# Patient Record
Sex: Male | Born: 1950 | Race: White | Hispanic: No | Marital: Married | Smoking: Former smoker
Health system: Southern US, Community
[De-identification: ages and names within clinical notes are randomized; demographics above are authoritative.]

## PROBLEM LIST (undated history)

## (undated) DIAGNOSIS — R112 Nausea with vomiting, unspecified: Secondary | ICD-10-CM

## (undated) DIAGNOSIS — K449 Diaphragmatic hernia without obstruction or gangrene: Secondary | ICD-10-CM

## (undated) DIAGNOSIS — D369 Benign neoplasm, unspecified site: Secondary | ICD-10-CM

## (undated) DIAGNOSIS — K635 Polyp of colon: Secondary | ICD-10-CM

## (undated) DIAGNOSIS — G4733 Obstructive sleep apnea (adult) (pediatric): Secondary | ICD-10-CM

## (undated) DIAGNOSIS — Z9889 Other specified postprocedural states: Secondary | ICD-10-CM

## (undated) DIAGNOSIS — K589 Irritable bowel syndrome without diarrhea: Secondary | ICD-10-CM

## (undated) DIAGNOSIS — E785 Hyperlipidemia, unspecified: Secondary | ICD-10-CM

## (undated) DIAGNOSIS — E669 Obesity, unspecified: Secondary | ICD-10-CM

## (undated) DIAGNOSIS — I493 Ventricular premature depolarization: Secondary | ICD-10-CM

## (undated) DIAGNOSIS — K649 Unspecified hemorrhoids: Secondary | ICD-10-CM

## (undated) DIAGNOSIS — J449 Chronic obstructive pulmonary disease, unspecified: Secondary | ICD-10-CM

## (undated) DIAGNOSIS — N189 Chronic kidney disease, unspecified: Secondary | ICD-10-CM

## (undated) DIAGNOSIS — I2510 Atherosclerotic heart disease of native coronary artery without angina pectoris: Secondary | ICD-10-CM

## (undated) DIAGNOSIS — I10 Essential (primary) hypertension: Secondary | ICD-10-CM

## (undated) HISTORY — DX: Diaphragmatic hernia without obstruction or gangrene: K44.9

## (undated) HISTORY — DX: Irritable bowel syndrome, unspecified: K58.9

## (undated) HISTORY — PX: CYSTECTOMY: SUR359

## (undated) HISTORY — DX: Benign neoplasm, unspecified site: D36.9

## (undated) HISTORY — DX: Unspecified hemorrhoids: K64.9

## (undated) HISTORY — DX: Essential (primary) hypertension: I10

## (undated) HISTORY — DX: Polyp of colon: K63.5

## (undated) HISTORY — DX: Obesity, unspecified: E66.9

## (undated) HISTORY — DX: Ventricular premature depolarization: I49.3

## (undated) HISTORY — DX: Hyperlipidemia, unspecified: E78.5

---

## 2007-12-27 ENCOUNTER — Ambulatory Visit: Payer: Self-pay | Admitting: Cardiology

## 2007-12-28 ENCOUNTER — Inpatient Hospital Stay (HOSPITAL_COMMUNITY): Admission: EM | Admit: 2007-12-28 | Discharge: 2007-12-29 | Payer: Self-pay | Admitting: Emergency Medicine

## 2007-12-28 ENCOUNTER — Encounter (INDEPENDENT_AMBULATORY_CARE_PROVIDER_SITE_OTHER): Payer: Self-pay

## 2007-12-29 ENCOUNTER — Encounter (INDEPENDENT_AMBULATORY_CARE_PROVIDER_SITE_OTHER): Payer: Self-pay

## 2007-12-29 LAB — CONVERTED CEMR LAB: TSH: 0.967 microintl units/mL

## 2008-01-10 ENCOUNTER — Ambulatory Visit: Payer: Self-pay | Admitting: Cardiology

## 2008-01-10 ENCOUNTER — Encounter: Payer: Self-pay | Admitting: Cardiology

## 2008-01-10 ENCOUNTER — Ambulatory Visit (HOSPITAL_COMMUNITY): Admission: RE | Admit: 2008-01-10 | Discharge: 2008-01-10 | Payer: Self-pay | Admitting: Cardiology

## 2008-04-10 ENCOUNTER — Ambulatory Visit: Payer: Self-pay | Admitting: Cardiology

## 2008-09-04 DIAGNOSIS — I10 Essential (primary) hypertension: Secondary | ICD-10-CM

## 2008-09-04 DIAGNOSIS — E119 Type 2 diabetes mellitus without complications: Secondary | ICD-10-CM | POA: Insufficient documentation

## 2008-10-09 ENCOUNTER — Ambulatory Visit: Payer: Self-pay | Admitting: Internal Medicine

## 2008-10-09 DIAGNOSIS — D126 Benign neoplasm of colon, unspecified: Secondary | ICD-10-CM

## 2008-10-09 DIAGNOSIS — R143 Flatulence: Secondary | ICD-10-CM

## 2008-10-09 DIAGNOSIS — Z860100 Personal history of colon polyps, unspecified: Secondary | ICD-10-CM | POA: Insufficient documentation

## 2008-10-09 DIAGNOSIS — R109 Unspecified abdominal pain: Secondary | ICD-10-CM | POA: Insufficient documentation

## 2008-10-09 DIAGNOSIS — R141 Gas pain: Secondary | ICD-10-CM

## 2008-10-09 DIAGNOSIS — R142 Eructation: Secondary | ICD-10-CM

## 2008-10-10 ENCOUNTER — Encounter: Payer: Self-pay | Admitting: Internal Medicine

## 2008-10-16 ENCOUNTER — Encounter: Payer: Self-pay | Admitting: Internal Medicine

## 2008-11-04 ENCOUNTER — Encounter: Payer: Self-pay | Admitting: Internal Medicine

## 2008-11-04 ENCOUNTER — Ambulatory Visit: Payer: Self-pay | Admitting: Internal Medicine

## 2008-11-04 ENCOUNTER — Ambulatory Visit (HOSPITAL_COMMUNITY): Admission: RE | Admit: 2008-11-04 | Discharge: 2008-11-04 | Payer: Self-pay | Admitting: Internal Medicine

## 2008-11-07 ENCOUNTER — Encounter: Payer: Self-pay | Admitting: Internal Medicine

## 2008-11-07 ENCOUNTER — Encounter (INDEPENDENT_AMBULATORY_CARE_PROVIDER_SITE_OTHER): Payer: Self-pay

## 2008-11-10 ENCOUNTER — Ambulatory Visit: Payer: Self-pay | Admitting: Cardiology

## 2008-11-10 ENCOUNTER — Telehealth (INDEPENDENT_AMBULATORY_CARE_PROVIDER_SITE_OTHER): Payer: Self-pay

## 2008-11-10 DIAGNOSIS — R002 Palpitations: Secondary | ICD-10-CM

## 2008-12-30 ENCOUNTER — Encounter: Payer: Self-pay | Admitting: Gastroenterology

## 2009-04-23 ENCOUNTER — Encounter: Payer: Self-pay | Admitting: Gastroenterology

## 2009-06-03 ENCOUNTER — Ambulatory Visit: Payer: Self-pay | Admitting: Internal Medicine

## 2009-06-05 ENCOUNTER — Ambulatory Visit (HOSPITAL_COMMUNITY): Admission: RE | Admit: 2009-06-05 | Discharge: 2009-06-05 | Payer: Self-pay | Admitting: Internal Medicine

## 2009-06-08 ENCOUNTER — Encounter: Payer: Self-pay | Admitting: Internal Medicine

## 2009-06-22 ENCOUNTER — Encounter: Payer: Self-pay | Admitting: Internal Medicine

## 2009-06-22 ENCOUNTER — Observation Stay (HOSPITAL_COMMUNITY): Admission: RE | Admit: 2009-06-22 | Discharge: 2009-06-24 | Payer: Self-pay | Admitting: Urology

## 2009-06-29 ENCOUNTER — Encounter: Payer: Self-pay | Admitting: Internal Medicine

## 2009-07-02 ENCOUNTER — Ambulatory Visit (HOSPITAL_COMMUNITY): Admission: RE | Admit: 2009-07-02 | Discharge: 2009-07-02 | Payer: Self-pay | Admitting: Urology

## 2009-07-16 ENCOUNTER — Telehealth: Payer: Self-pay | Admitting: Urgent Care

## 2009-07-23 ENCOUNTER — Ambulatory Visit (HOSPITAL_COMMUNITY): Admission: RE | Admit: 2009-07-23 | Discharge: 2009-07-23 | Payer: Self-pay | Admitting: Urology

## 2009-08-31 ENCOUNTER — Encounter (INDEPENDENT_AMBULATORY_CARE_PROVIDER_SITE_OTHER): Payer: Self-pay | Admitting: *Deleted

## 2009-09-08 ENCOUNTER — Ambulatory Visit (HOSPITAL_COMMUNITY): Admission: RE | Admit: 2009-09-08 | Discharge: 2009-09-08 | Payer: Self-pay | Admitting: Urology

## 2009-11-30 ENCOUNTER — Encounter (INDEPENDENT_AMBULATORY_CARE_PROVIDER_SITE_OTHER): Payer: Self-pay | Admitting: *Deleted

## 2009-12-10 ENCOUNTER — Emergency Department (HOSPITAL_COMMUNITY): Admission: EM | Admit: 2009-12-10 | Discharge: 2009-05-06 | Payer: Self-pay | Admitting: Emergency Medicine

## 2009-12-21 ENCOUNTER — Ambulatory Visit (HOSPITAL_COMMUNITY)
Admission: RE | Admit: 2009-12-21 | Discharge: 2009-12-21 | Payer: Self-pay | Source: Home / Self Care | Attending: Urology | Admitting: Urology

## 2010-02-03 DIAGNOSIS — I2510 Atherosclerotic heart disease of native coronary artery without angina pectoris: Secondary | ICD-10-CM

## 2010-02-03 HISTORY — DX: Atherosclerotic heart disease of native coronary artery without angina pectoris: I25.10

## 2010-02-04 NOTE — Letter (Signed)
Summary: referral-dr gary carl  referral-dr gary carl   Imported By: Rosine Beat 06/22/2009 13:14:54  _____________________________________________________________________  External Attachment:    Type:   Image     Comment:   External Document

## 2010-02-04 NOTE — Medication Information (Signed)
Summary: Tax adviser   Imported By: Rosine Beat 08/31/2009 09:16:53  _____________________________________________________________________  External Attachment:    Type:   Image     Comment:   External Document

## 2010-02-04 NOTE — Letter (Signed)
Summary: CT SCAN ORDER  CT SCAN ORDER   Imported By: Ave Filter 06/03/2009 14:25:15  _____________________________________________________________________  External Attachment:    Type:   Image     Comment:   External Document

## 2010-02-04 NOTE — Assessment & Plan Note (Signed)
Summary: bloating and abdominal pain- cdg   Visit Type:  Follow-up Visit Primary Care Provider:  Melynda Keller  Chief Complaint:  bloating and abdominal pain.  History of Present Illness: Jerry Williams is here for f/u. He was last seen at time of TCS 11/10. Prep was poor. He had polyps as follows; one mid descending status post hot snare removal, one ascending colon polyp status post cold snare removed, multiple diminutive ascending colon polyp status post hot snare ablation. His f/u TCS will be 11/2011. He c/o ongoing abdominal bloating and excessive gas. In the last three weeks, he has had 4-5 severe episodes of upper abd pain radiating into flanks, mid-abd cramping, which wakes him up at night. He went to ED in 5/11 and w/u for MI which was negative. He states most symptoms are after he goes to bed. If he has really bad night, symptoms may persist throughout following day. He also notes pp excessive belching. He wakes up at times with bitter taste in mouth and heartburn. Takes Nexium at lunchtime. Often has BM with abd cramping that wakes him up at night. Takes Bentyl four times per day and occasionally additional one in middle of night. No melena, brbpr. Has BM two times a day. Never eats past 6pm. Goes to bed at 11pm. Weight up ten pounds.        Current Medications (verified): 1)  Metoprolol Tartrate 50 Mg Tabs (Metoprolol Tartrate) .... Two Times A Day 2)  Lopid 600 Mg Tabs (Gemfibrozil) .... Two Times A Day 3)  Bentyl 20 Mg Tabs (Dicyclomine Hcl) .... Qid As Needed 4)  Janumet 50-1000 Mg Tabs (Sitagliptin-Metformin Hcl) .... Two Times A Day 5)  Benazepril Hcl 20 Mg Tabs (Benazepril Hcl) .... Once Daily 6)  Fibercon 625 Mg Tabs (Calcium Polycarbophil) .... 2 By Mouth Daily 7)  Nexium 40 Mg Cpdr (Esomeprazole Magnesium) .... One By Mouth Daily 8)  Benazepril Hcl 10 Mg Tabs (Benazepril Hcl) .... Take 1 Tablet By Mouth Once A Day 9)  Actos 30 Mg Tabs (Pioglitazone Hcl) .... Take 1 Tablet  By Mouth Once A Day 10)  Magnesium 250 Mg .... One Tablet Daily 11)  Aspir-Trin 325 Mg Tbec (Aspirin) .... Take 1 Tablet By Mouth Once A Day  Allergies (verified): 1)  ! 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 Elpidio Anis, 2 polyps were not ablated due to awkward positioning Hyperlipidemia Obesity PVCs IBS TCS 11/10 Poor prep compromised exam, grossly normal rectum, left-sided diverticula, polyps as follows; one mid descending status post hot snare removal, one ascending colon polyp status post cold snare removed, multiple diminutive ascending colon polyp status post hot snare ablation. Follow up TCS in 3 years.  Review of Systems      See HPI  Vital Signs:  Patient profile:   60 year old male Height:      72 inches Weight:      246 pounds BMI:     33.48 Temp:     98.6 degrees F oral Pulse rate:   80 / minute BP sitting:   130 / 80  (left arm) Cuff size:   regular  Vitals Entered By: Jerry Spring LPN (June 03, 452 1:33 PM)  Physical Exam  General:  Well developed, well nourished, no acute distress.obese.   Head:  Normocephalic and atraumatic. Eyes:  sclera nonicteric  Mouth:  op moist Lungs:  Clear throughout to auscultation. Heart:  Regular rate and rhythm; no murmurs, rubs,  or bruits. Abdomen:  Obese. Soft. Positive bowel sounds. Tenderness with subjective guarding noted in rlq. Also diffuse upper abd tenderness. Due to patient discomfort, could not evaluate for HSM or masses. No abd bruit. Extremities:  No clubbing, cyanosis, edema or deformities noted. Neurologic:  Alert and  oriented x4;  grossly normal neurologically. Skin:  Intact without significant lesions or rashes. Psych:  Alert and cooperative. Normal mood and affect.  Impression & Recommendations:  Problem #  1:  ABDOMINAL PAIN, UNSPECIFIED SITE (ICD-789.00)  Persistent abd pain wakes him up at night. Vague pp belching and bloating. BM regular. Significant tenderness on exam today. Recommend CT A/P with IV/oral contrast. Will add probiotic one daily for four weeks. He will take Nexium before evening meal. Further recommendations to follow.  Orders: Est. Patient Level II (09811)

## 2010-02-04 NOTE — Letter (Signed)
Summary: Appointment - Reminder 2  [facility] [place]. [address] [place] [address]   Phone: [phone]  Fax: [phone]     November 30, 2009 MRN: [id number]   [facility] [address] [place], [place]  [address]   Dear Jerry Williams,  Our records indicate that it is time to schedule a follow-up appointment.  Dr.  Daleen Squibb        recommended that you follow up with [place] in     11/2009 PAST DUE       . It is very important that we reach you to schedule this appointment. We look forward to participating in your health care needs. Please contact us at the number listed above at your earliest convenience to schedule your appointment.  If you are unable to make an appointment at this time, give [place] a call so we can update our records.     Sincerely,   Glass blower/designer

## 2010-02-04 NOTE — Letter (Signed)
Summary: DR Baldo Ash REFERRAL-UROLOGY  DR CARL REFERRAL-UROLOGY   Imported By: Ave Filter 06/08/2009 11:24:19  _____________________________________________________________________  External Attachment:    Type:   Image     Comment:   External Document

## 2010-02-04 NOTE — Letter (Signed)
Summary: UROLOGY REFERRAL  UROLOGY REFERRAL   Imported By: Ave Filter 06/08/2009 09:03:15  _____________________________________________________________________  External Attachment:    Type:   Image     Comment:   External Document  Appended Document: UROLOGY REFERRAL I cx office visit per pt's wife.[facility] require a 150 dollar downpayment for all uninsured pts.

## 2010-02-04 NOTE — Progress Notes (Signed)
Summary: nexium  ---- Converted from flag ---- ---- 07/16/2009 10:42 AM, Peggyann Shoals wrote: Pt's wife called and said he needs a new Rx for Nexium sent to [facility]- pts # is [phone] ------------------------------  Appended Document: nexium    Prescriptions: NEXIUM 40 MG CPDR (ESOMEPRAZOLE MAGNESIUM) one by mouth daily  #90 x 3   Entered and Authorized by:   Joselyn Arrow FNP-BC   Signed by:   Joselyn Arrow FNP-BC on 07/20/2009   Method used:   Print then Give to Patient   RxID:   [id number]

## 2010-02-04 NOTE — Letter (Signed)
Summary: office consult note-aus-dr peterson, l  office consult note-aus-dr peterson, l   Imported By: Rosine Beat 06/29/2009 11:37:03  _____________________________________________________________________  External Attachment:    Type:   Image     Comment:   External Document

## 2010-02-04 NOTE — Medication Information (Signed)
Summary: pt assistance form-nexium  pt assistance form-nexium   Imported By: Hendricks Limes LPN 91/47/8295 62:13:08  _____________________________________________________________________  External Attachment:    Type:   Image     Comment:   External Document

## 2010-03-15 LAB — URINE CULTURE
Colony Count: >=100000
Culture  Setup Time: 201112200140
Special Requests: NEGATIVE

## 2010-03-15 LAB — GLUCOSE, CAPILLARY
Glucose-Capillary: 142 mg/dL — ABNORMAL HIGH (ref 70–99)
Glucose-Capillary: 155 mg/dL — ABNORMAL HIGH (ref 70–99)

## 2010-03-16 LAB — CBC
HCT: 41.6 % (ref 39.0–52.0)
Hemoglobin: 13.9 g/dL (ref 13.0–17.0)
MCH: 30.1 pg (ref 26.0–34.0)
MCHC: 33.4 g/dL (ref 30.0–36.0)
MCV: 90 fL (ref 78.0–100.0)
Platelets: 202 10*3/uL (ref 150–400)
RBC: 4.62 MIL/uL (ref 4.22–5.81)
RDW: 13.7 % (ref 11.5–15.5)
WBC: 8.7 10*3/uL (ref 4.0–10.5)

## 2010-03-16 LAB — COMPREHENSIVE METABOLIC PANEL
ALT: 23 U/L (ref 0–53)
AST: 34 U/L (ref 0–37)
Albumin: 3.4 g/dL — ABNORMAL LOW (ref 3.5–5.2)
Alkaline Phosphatase: 74 U/L (ref 39–117)
BUN: 16 mg/dL (ref 6–23)
CO2: 22 mEq/L (ref 19–32)
Calcium: 9.6 mg/dL (ref 8.4–10.5)
Chloride: 99 mEq/L (ref 96–112)
Creatinine, Ser: 1.01 mg/dL (ref 0.4–1.5)
GFR calc Af Amer: >60 mL/min (ref >60)
GFR calc non Af Amer: >60 mL/min (ref >60)
Glucose, Bld: 133 mg/dL — ABNORMAL HIGH (ref 70–99)
Potassium: 3.8 mEq/L (ref 3.5–5.1)
Sodium: 136 mEq/L (ref 135–145)
Total Bilirubin: 0.5 mg/dL (ref 0.3–1.2)
Total Protein: 7.4 g/dL (ref 6.0–8.3)

## 2010-03-16 LAB — SURGICAL PCR SCREEN
MRSA, PCR: NEGATIVE
Staphylococcus aureus: NEGATIVE

## 2010-03-18 LAB — COMPREHENSIVE METABOLIC PANEL
ALT: 17 U/L (ref 0–53)
AST: 23 U/L (ref 0–37)
Albumin: 4 g/dL (ref 3.5–5.2)
Alkaline Phosphatase: 59 U/L (ref 39–117)
BUN: 16 mg/dL (ref 6–23)
Calcium: 10 mg/dL (ref 8.4–10.5)
Chloride: 106 mEq/L (ref 96–112)
Creatinine, Ser: 0.69 mg/dL (ref 0.4–1.5)
GFR calc Af Amer: >60 mL/min (ref >60)
GFR calc non Af Amer: >60 mL/min (ref >60)
Glucose, Bld: 263 mg/dL — ABNORMAL HIGH (ref 70–99)
Potassium: 3.9 mEq/L (ref 3.5–5.1)

## 2010-03-18 LAB — SURGICAL PCR SCREEN: Staphylococcus aureus: NEGATIVE

## 2010-03-18 LAB — CBC: MCHC: 34.7 g/dL (ref 30.0–36.0)

## 2010-03-20 LAB — GLUCOSE, CAPILLARY: Glucose-Capillary: 104 mg/dL — ABNORMAL HIGH (ref 70–99)

## 2010-03-21 LAB — GLUCOSE, CAPILLARY
Glucose-Capillary: 173 mg/dL — ABNORMAL HIGH (ref 70–99)
Glucose-Capillary: 218 mg/dL — ABNORMAL HIGH (ref 70–99)
Glucose-Capillary: 233 mg/dL — ABNORMAL HIGH (ref 70–99)
Glucose-Capillary: 249 mg/dL — ABNORMAL HIGH (ref 70–99)
Glucose-Capillary: 257 mg/dL — ABNORMAL HIGH (ref 70–99)
Glucose-Capillary: 257 mg/dL — ABNORMAL HIGH (ref 70–99)
Glucose-Capillary: 258 mg/dL — ABNORMAL HIGH (ref 70–99)
Glucose-Capillary: 274 mg/dL — ABNORMAL HIGH (ref 70–99)
Glucose-Capillary: 282 mg/dL — ABNORMAL HIGH (ref 70–99)
Glucose-Capillary: 293 mg/dL — ABNORMAL HIGH (ref 70–99)
Glucose-Capillary: 387 mg/dL — ABNORMAL HIGH (ref 70–99)

## 2010-03-21 LAB — COMPREHENSIVE METABOLIC PANEL
ALT: 37 U/L (ref 0–53)
AST: 25 U/L (ref 0–37)
Albumin: 3.1 g/dL — ABNORMAL LOW (ref 3.5–5.2)
Alkaline Phosphatase: 59 U/L (ref 39–117)
BUN: 30 mg/dL — ABNORMAL HIGH (ref 6–23)
CO2: 22 mEq/L (ref 19–32)
Calcium: 9.3 mg/dL (ref 8.4–10.5)
Chloride: 102 mEq/L (ref 96–112)
Creatinine, Ser: 1.22 mg/dL (ref 0.4–1.5)
GFR calc Af Amer: >60 mL/min (ref >60)
GFR calc non Af Amer: >60 mL/min (ref >60)
Glucose, Bld: 237 mg/dL — ABNORMAL HIGH (ref 70–99)
Potassium: 3.9 mEq/L (ref 3.5–5.1)
Sodium: 133 mEq/L — ABNORMAL LOW (ref 135–145)
Total Bilirubin: 1.2 mg/dL (ref 0.3–1.2)
Total Protein: 7.5 g/dL (ref 6.0–8.3)

## 2010-03-21 LAB — CBC
HCT: 42 % (ref 39.0–52.0)
Hemoglobin: 14.1 g/dL (ref 13.0–17.0)
MCHC: 33.6 g/dL (ref 30.0–36.0)
MCV: 90 fL (ref 78.0–100.0)
Platelets: 165 10*3/uL (ref 150–400)
RBC: 4.67 MIL/uL (ref 4.22–5.81)
RDW: 14.2 % (ref 11.5–15.5)
WBC: 9.3 10*3/uL (ref 4.0–10.5)

## 2010-03-21 LAB — URINE MICROSCOPIC-ADD ON

## 2010-03-21 LAB — URINE CULTURE
Colony Count: >=100000
Special Requests: POSITIVE

## 2010-03-21 LAB — DIFFERENTIAL
Basophils Absolute: 0 10*3/uL (ref 0.0–0.1)
Basophils Relative: 0 % (ref 0–1)
Eosinophils Absolute: 0 10*3/uL (ref 0.0–0.7)
Eosinophils Relative: 0 % (ref 0–5)
Lymphocytes Relative: 7 % — ABNORMAL LOW (ref 12–46)
Lymphs Abs: 0.7 10*3/uL (ref 0.7–4.0)
Monocytes Absolute: 0.6 10*3/uL (ref 0.1–1.0)
Monocytes Relative: 7 % (ref 3–12)
Neutro Abs: 7.9 10*3/uL — ABNORMAL HIGH (ref 1.7–7.7)
Neutrophils Relative %: 86 % — ABNORMAL HIGH (ref 43–77)

## 2010-03-21 LAB — URINALYSIS, ROUTINE W REFLEX MICROSCOPIC
Glucose, UA: 250 mg/dL — AB
Nitrite: POSITIVE — AB
Protein, ur: 100 mg/dL — AB
Specific Gravity, Urine: 1.021 (ref 1.005–1.030)
Urobilinogen, UA: 1 mg/dL (ref 0.0–1.0)
pH: 5.5 (ref 5.0–8.0)

## 2010-03-21 LAB — SURGICAL PCR SCREEN: Staphylococcus aureus: POSITIVE — AB

## 2010-03-23 LAB — CBC
Hemoglobin: 14.9 g/dL (ref 13.0–17.0)
MCHC: 35.8 g/dL (ref 30.0–36.0)
Platelets: 189 10*3/uL (ref 150–400)
RDW: 13.9 % (ref 11.5–15.5)

## 2010-03-23 LAB — DIFFERENTIAL
Basophils Absolute: 0 10*3/uL (ref 0.0–0.1)
Basophils Relative: 1 % (ref 0–1)
Monocytes Absolute: 0.5 10*3/uL (ref 0.1–1.0)
Neutro Abs: 2.4 10*3/uL (ref 1.7–7.7)
Neutrophils Relative %: 47 % (ref 43–77)

## 2010-03-23 LAB — BASIC METABOLIC PANEL
BUN: 15 mg/dL (ref 6–23)
CO2: 22 mEq/L (ref 19–32)
Calcium: 9.7 mg/dL (ref 8.4–10.5)
Creatinine, Ser: 0.93 mg/dL (ref 0.4–1.5)
Glucose, Bld: 177 mg/dL — ABNORMAL HIGH (ref 70–99)

## 2010-03-23 LAB — POCT CARDIAC MARKERS
CKMB, poc: 1.1 ng/mL (ref 1.0–8.0)
CKMB, poc: <1 ng/mL — ABNORMAL LOW (ref 1.0–8.0)

## 2010-05-18 NOTE — Discharge Summary (Signed)
NAME:  Jerry Williams, Jerry Williams             ACCOUNT NO.:  [id number]   MEDICAL RECORD NO.:  [id number]          PATIENT TYPE:  INP   LOCATION:  A331                          FACILITY:  [facility]   PHYSICIAN:  Skeet Latch, DO    DATE OF BIRTH:  1950-07-07   DATE OF ADMISSION:  12/27/2007  DATE OF DISCHARGE:  12/26/2009LH                               DISCHARGE SUMMARY   DISCHARGE DIAGNOSES:  1. Tachy-palpitations, resolved.  2. History of hypertension.  3. History of diabetes.  4. History of diverticulosis.  5. History of hyperlipidemia.   BRIEF HOSPITAL COURSE:  This is a 60 year old male who presented to  emergency room with complaints of rapid heartbeat and fluttering of his  heart.  The patient reported that his heart rate was going fast and  skipping beats intermittently.  The patient reports also having some  lightheadedness, but no syncopal episode, and also stating he is  diaphoretic.  The patient states symptoms started on and off for last  approximately 6 months.  The patient has no history of any cardiac  evaluation of any kind.  The patient denied any chest pain or shortness  of breath.  EKG showed some multiple PVCs with bigeminy.  In the ER, the  patient's heart rate was in 130s.  His EKG showed sinus rhythm.  The  patient was admitted with consultation with [facility].  As  stated, initial EKG showed normal sinus rhythm with multiple PVCs.  Initial blood work showed a white count of 5.9, hemoglobin 15.9,  hematocrit 46, platelet count 217,000.  Sodium was 139, potassium 3.9,  chloride 109, CO2 of 22, glucose 176, BUN 11, and creatinine 0.83.  Initial cardiac enzymes were unremarkable.  Chest x-ray showed  cardiomegaly with COPD.  The patient was admitted and placed on his home  medications as well as placed on DVT prophylaxis and GI prophylaxis.  Consultation with Cardiology, the patient was found to have tachy-  palpitations.  They will rule out paroxysmal atrial  fibrillation.  During his evaluation, the patient was in sinus rhythm with PVCs.  The  patient has had a history of cardiac catheterization (5 years ago),  which he states was normal.  The patient was started on metoprolol 25 mg  p.o. q.8 h.  He was continuing on aspirin.  [facility], Dr.  Daleen Squibb, stated he would arrange followup with himself in [place] on  January 10, 2008, and at that time we will obtain a 2-D echocardiogram,  Holter monitor, and a stress study.  On my examination, the patient  states that his palpitations resolved.  He is feeling well, has no  complaints and the patient is ready for discharge.  Current vitals show  a temperature of 97.6, pulse 64, respirations 18, blood pressure 110/61,  and he is sating 97% on room air.   LABORATORY DATA:  Sodium 137, potassium 4.5, chloride 104, CO2 of 25,  glucose 306, BUN 14, creatinine 1.17.   MEDICATIONS ON DISCHARGE:  1. Lopid 1 tablet twice a day.  2. Metformin ER 500 mg 4 times a day.  3. Januvia 100  mg daily.  4. Bentyl 20 mg 4 times day.  5. Aspirin 325 mg daily until seen by Cardiology.  6. Benazepril 20 mg daily.  7. Prandin 2 mg 3 times a day.  8. Metoprolol 50 mg twice a day.   CONDITION ON DISCHARGE:  Stable.   DISPOSITION:  The patient to be discharged to home.   DISCHARGE INSTRUCTIONS:  The patient to maintain a 1800-2000 calorie ADA  diet.  The patient to increase his activity slowly.  The patient is to  follow up with his primary care physician which I believe is a Scientist, water quality in [place] next week.  The patient is to follow up with  Dr. Daleen Squibb on January 10, 2008.  The patient will be given phone number and  location by nursing staff.  The patient is to take medications as  directed.  The patient is to return to emergency room if he has any  increasing palpitations, shortness of breath, chest pain, or any similar  symptoms and/or call 911.      Skeet Latch, DO  Electronically  Signed     SM/MEDQ  D:  12/29/2007  T:  12/30/2007  Job:  [id number]   cc:   Thomas C. Wall, MD, FACC  [address]  [address]  [place]  [place] [address]

## 2010-05-18 NOTE — Consult Note (Signed)
NAME:  Jerry Williams             ACCOUNT NO.:  [id number]   MEDICAL RECORD NO.:  [id number]          PATIENT TYPE:  INP   LOCATION:  A331                          FACILITY:  [facility]   PHYSICIAN:  Thomas C. Wall, MD, FACCDATE OF BIRTH:  09-19-1950   DATE OF CONSULTATION:  DATE OF DISCHARGE:                                 CONSULTATION   I was asked by the [employer] Team, specifically Dr.  Sherle Poe, to consult on Jerry Williams, a 60 year old gentleman who  came in yesterday early morning, with tachy palpitations and dizziness.  He did not have a fainting spell.   HISTORY OF PRESENT ILLNESS:  Jerry Williams is a pleasant 60 year old white  male, who gives a history of having some tachy palpitations while mowing  the grass back in July and in August.  Since that time, he has not been  doing a lot of manual work around the house.  He denied any chest pain  at that time, but he has noticed some increased dyspnea on exertion.  He  states I does get tired.   Last evening, at his son's Christmas party, he began to have some  palpitations intermittently.  When he got home, he had a real bad  episode.  He felt like it was just bouncing around in his chest.  He  became dizzy, but did not faint.  He had no chest pain, nausea,  vomiting, or diaphoresis.   He came to the emergency room early this morning.  In the emergency  room, it is reported that his heart rate was 130, but there is no  telemetry recording and Jerry Williams was not obtained at that time.  His blood  pressure was elevated at that time at a diastolic of 100.   His laboratory data was unremarkable, except for a low normal potassium  at 3.6.  His magnesium levels 2.0.  Williams shows normal sinus rhythm with  PVCs.  The computer called Jerry inferior wall infarct, but I do not agree  with that.   He was admitted to telemetry.  Since that time, he has been in sinus  rhythm with PVCs.   PAST MEDICAL HISTORY:  Significant for a type  2 diabetes.  He also has a  history of hypertension and mixed hyperlipidemia.  He gives a history of  having a cardiac catheterization about 5 years ago at [facility].  There is no record in the computer.  He states that he was  told his heart was normal.   He does not give a history of any previous cardiac condition or  arrhythmia such as atrial fibrillation.  He is certainly at risk for  this.   ALLERGIES:  PENICILLIN.   MEDICATIONS:  On admission are listed in the ER report.  Please refer to  that.  He is currently on:  1. Albuterol inhaler.  2. Aspirin 81 mg a day.  3. Benazepril 20 mg per day.  4. Bentyl.  5. Enoxaparin 40 mg subcu q. 24.  6. Gemfibrozil 600 mg p.o. b.i.d.  7. Insulin.  8. Metformin 500  b.i.d.  9. Protonix 40 mg a day.  10.Prandin 2 mg p.o. t.i.d.  11.Januvia 100 mg p.o. daily and p.r.n.   SOCIAL HISTORY:  He lives in [place].  He is married.  He has not  had a job in about 10 years.  He used to work really hard in the [employer] in [place], [place].  He is very depressed about not  being independent financially.  He states he just cannot afford his  medications.   FAMILY HISTORY:  Really noncontributory.   REVIEW OF SYSTEMS:  Other than HPI is negative.  He denies orthopnea,  PND, peripheral edema, or syncope.   PHYSICAL EXAMINATION:  GENERAL:  Today, he is in no acute distress.  He  is fairly emotional.  VITAL SIGNS:  His blood pressure is 157/93 and pulse is 100 and  irregular.  He has PVCs, which are unifocal at this point.  Respirations  are 20, temperature is 98.2, room air sats 97%, he weighs 107.3 kg, and  72 inches tall.  HEENT:  He has curly hair with receding hairline.  He has ear lobe  creases.  Eyebrows were normal.  PERRLA.  Extraocular movements intact.  Sclerae are clear.  Facial symmetry is normal.  He does not have the  dentures in upper and lower.  Oral mucosa is normal.  NECK:  Carotid upstrokes  are equal bilateral without bruits.  No obvious  JVD.  Thyroid is not enlarged.  Trachea is midline.  LUNGS:  Clear to auscultation and percussion.  HEART:  A poorly appreciated PMI.  Normal S1 and S2.  No gallop or  murmur.  ABDOMEN:  Protuberant, good bowel sounds.  Organomegaly could not be  adequately assessed because of his size.  EXTREMITIES:  No edema.  Pulses are intact, but reduced at 1+/4+.  There is no sign of DVT.  NEURO:  Grossly intact.  SKIN:  Unremarkable.   X-ray showed some mild cardiomegaly, active disease.   A TSH has not been checked.  His magnesium level was normal.  BNP was  less than 30.  Cardiac markers in the ER were negative.   ASSESSMENT:  1. Tachy palpitations, rule out paroxysmal atrial fibrillation at      least by history.  He is certainly at risk.  He is currently in      sinus rhythm with premature ventricular contractions.  2. Hypokalemia, 3.4 this morning.  3. History of cardiac catheterization about 5 years ago per his      history that is said to be normal.  4. Type 2 diabetes.  5. Hypertension.  6. Mixed hyperlipidemia.  7. Obesity.  8. Financial vulnerability without a job and being self paid.  He is      very concerned about this.   RECOMMENDATIONS:  1. Increase potassium greater than 4.  2. Metoprolol 25 mg p.o. q. 8.  If he is discharged tomorrow, we would      discharge him on 50 mg p.o. b.i.d.  3. Aspirin 325 mg a day for now.  4. Potassium supplement at home.  5. Avoid albuterol as much as possible.  6. Social Services consult.   I will arrange followup with me in the clinic here in the [place] in  [place] on January 10, 2007.  We will obtain a 2-D echocardiogram at  that time, Holter monitor, and a stress study.      Thomas C. Daleen Squibb, MD, [facility]  Electronically Signed  TCW/MEDQ  D:  12/28/2007  T:  12/28/2007  Job:  [id number]

## 2010-05-18 NOTE — H&P (Signed)
NAME:  Jerry Williams, Jerry Williams             ACCOUNT NO.:  [id number]   MEDICAL RECORD NO.:  [id number]          PATIENT TYPE:  INP   LOCATION:  A331                          FACILITY:  [facility]   PHYSICIAN:  Margaretmary Dys, M.D.DATE OF BIRTH:  [birth year]   DATE OF ADMISSION:  12/27/2007  DATE OF DISCHARGE:  LH                              HISTORY & PHYSICAL   PRIMARY CARE PHYSICIAN:  Dr. Eulah Pont.   ADMISSION DIAGNOSES:  1. Arrhythmias.  2. Poorly controlled hypertension.  3. Morbid obesity.   CHIEF COMPLAINTS:  Fluttering of the heart and dizziness.   HISTORY OF PRESENT ILLNESS:  Jerry Williams is a 60 year old male who  presented to the emergency room with complaints of rapid heartbeat and  fluttering of his heart.  The patient reports what appears to be  palpitations.  The patient reports his heart rate going past and  skipping beats intermittently.  The patient reports having some  lightheadedness but no syncope.  The patient was very diaphoretic. The  patient said his symptoms started on and off about 6 months now.  He has  not had a cardiology evaluation.  The patient denies any chest pain or  shortness of breath.  The patient remains very distressed about the  symptoms.  His EKG does show multiple premature ventricular contractions  with bigemini.   HOSPITAL COURSE:  The patient initially arrived in the emergency room.  His heart rate may have been 130, but this was not really captured by  the EKG.  His EKG was sinus rhythm.   The patient has now been admitted for further evaluation and management  and cardiology evaluation.   REVIEW OF SYSTEMS:  A 10-point review of systems was negative.  No  fevers or chills.  No headaches.  No confusion.  The patient did have  lightheadedness.  No seizure or syncope.   PAST MEDICAL HISTORY:  1. Hypertension.  2. Diabetes.  3. Diverticulosis.  4. Hypercholesterolemia.   MEDICATIONS:  The patient is on metformin 500 mg p.o. q.i.d.,  Januvia  400 mg p.o. once a day, Bentyl 20 mg p.o. q.i.d., aspirin 81 mg once a  day, benazepril 20 mg p.o. once a day, Prandin 2 mg p.o. t.i.d.   ALLERGIES:  PENICILLIN.   SOCIAL HISTORY:  Does not smoke, does not drink alcohol, and no history  of illicit drug abuse.   FAMILY HISTORY:  Positive for hypertension.   PHYSICAL EXAM:  GENERAL APPEARANCE:  Conscious, alert, comfortable, not  in acute distress. Alert and oriented to time, place, and person.  VITAL SIGNS: Blood pressure was 168/105, pulse is 88, respirations 20,  temperature 97 degrees Fahrenheit, oxygen saturation was 99% on room  air.  HEENT: Normocephalic, atraumatic.  Oral mucosa is moist with no  exudates.  NECK: Supple.  No JVD or lymphadenopathy.  LUNGS: Clear with clinically good air entry bilaterally.  HEART:  S1, S2. Multiple ectopic beats are heard.   ABDOMEN: Soft, nontender.  Obese.  Bowel sounds are positive.  No masses  palpable.  EXTREMITIES: No edema.  No calf induration or tenderness is noted.  CNS:  Exam is grossly intact with no focal neurological deficits.   LABORATORY STUDIES:  A 12-lead EKG shows normal sinus rhythm to multiple  premature ventricular contractions.  White blood cell count 5.9,  hemoglobin of 15.9, hematocrit 46, platelet count was 217 with no left  shift.  Sodium 139, potassium 3.6, chloride 109, CO2 22, glucose 176,  BUN 11, creatinine 0.83, calcium 9.7.  Cardiac enzymes, initial cardiac  markers were negative. Chest x-ray shows cardiomegaly with COPD.  No  active cardiopulmonary disease was seen.   ASSESSMENT/PLAN:  A 60 year old male presenting with palpitations.  A 12-  lead EKG was suggestive of multiple premature ventricular contractions.  We worry about possible true ventricular arrhythmia in him.   PLAN:  1. Admit the patient to medical floor.  2. Will place on telemetry.  3. Will resume all his home medications at this time and see if his      blood pressure can be  better controlled.  4. Will request cardiology to see him.  I did discuss him that the      cardiologist on call, Dr. Gala Romney, who recommended admitting the      patient here and Dr. Juanito Doom the cardiologist who will be seeing      the patient in consult.  5. I will place the patient on DVT prophylaxis with Lovenox, GI      prophylaxis with Protonix.  If the patient does significant      arrhythmia, we may transfer him to [facility] cardiology unit      urgently.  6. Code status.  The patient is a full code.      Margaretmary Dys, M.D.  Electronically Signed     AM/MEDQ  D:  12/28/2007  T:  12/28/2007  Job:  [id number]

## 2010-05-18 NOTE — Assessment & Plan Note (Signed)
[facility]                       [place] [facility] OFFICE NOTE   NAME:Williams, Jerry ATTAR                    MRN:          [id number]  DATE:01/10/2008                            DOB:          [birth year]    [name] returns today after being discharged from the hospital for  palpitations and presyncope.   Please see my consultative note from December 28, 2007, in the hospital.   He had sinus rhythm with PVCs.  No atrial fibrillation or atrial  dysrhythmia was noted.  His potassium was 3.4, but he went home with a  normal potassium of 4.5.  We placed him on metoprolol 50 mg p.o. b.i.d.  He is on magnesium 250 mg a day.   Since discharge, he has felt remarkably better.  He has had no  tachypalpitations.  He has had no presyncope.  He has had no chest pain.   He has multiple cardiac risk factors, but ruled out for myocardial  infarction.   MEDICATIONS:  Today include:  1. Lopid 1 tablet p.o. b.i.d.  2. Metformin 500 mg extended release 4 times a day.  3. Januvia 100 mg per day.  4. Bentyl 20 mg 4 times a day.  5. Benazepril 20 mg per day.  6. Prandin 2 mg t.i.d.  7. Metoprolol 50 mg p.o. b.i.d.  8. Aspirin 325 mg per day.  9. B12 1000 mg per day.  10.Magnesium 250 mg per day.  11.Aloe Vera 25 mg per day.   PHYSICAL EXAMINATION:  VITAL SIGNS:  Blood pressure today is 130/90, his  pulse is 68 and regular.  His weight is 240 and 6 feet tall.  Respiratory rate is 18 and unlabored.  GENERAL:  He is in no acute distress.  He is overweight.  SKIN:  Warm and dry.  NECK:  Carotid upstrokes are equal bilateral without bruits.  No JVD.  Thyroid is not enlarged.  Trachea is midline.  LUNGS:  Clear to auscultation.  No rhonchi or rales.  HEART:  A poorly appreciated PMI.  Normal S1 and S2.  ABDOMEN:  Protuberant, good bowel sounds.  Organomegaly cannot be  assessed.  EXTREMITIES:  No significant edema.  Pulses are intact.  There is no  sign of  DVT.   ASSESSMENT AND PLAN:  [name] had symptomatic premature ventricular  contractions, but no dysrhythmia per say.  Some of this may have been  precipitated by a potassium of 3.4.   His TSH and magnesium levels were normal in the hospital.   PLAN:  1. A 2-D echocardiogram to evaluate for any kind of structural heart      disease.  He may have significant LVH with his history of      hypertension and diabetes.  2. We will continue with aspirin 325 mg a day, metoprolol 50 b.i.d.,      and his benazepril 20 mg per day.  He is also supplementing with      magnesium.  Also, advised him on potassium rich foods.  I will see      him back again if he is doing well in  3-6 months.     Thomas C. Daleen Squibb, MD, [facility]  Electronically Signed    TCW/MedQ  DD: 01/10/2008  DT: 01/11/2008  Job #: [id number]

## 2010-05-18 NOTE — Assessment & Plan Note (Signed)
[facility]                       [place] [facility] OFFICE NOTE   NAME:Jerry Williams                    MRN:          [id number]  DATE:04/10/2008                            DOB:          [birth year]    Jerry Williams comes in today.   His chief complaint is that his bill was over 700 dollars for 2  encounters with me.  I explained that it must be some mistake having  seen him as a consult probably level V in the hospital and then level  III in the office last visit in January.  We will give him a number for  the business office to see if there was some mistake.   He says he feels a lot better than he has felt in a long time.  Unfortunately, he has gained another couple of pounds.  He has not taken  our message too heart about his weight.   He seems to be taking his medications.  His blood sugars are not under  tight control.   A 2-D echocardiogram back in January showed moderate LVH, which was  expected with his diabetes and hypertension, not to mention his obesity.  He had EF of 55% mild diastolic dysfunction.  No significant valvular  heart disease with only mild mitral regurgitation and a mildly left  atrial enlargement.   He has not had many palpitations, as he does not think his heart has  been out of rhythm.  We never proved atrial fib even in the hospital.   His meds are the same as last visit.  He said now he is on Januvia and  metformin 50/1000 b.i.d. as well as potassium 25 mEq over-the-counter  every other day.   PHYSICAL EXAMINATION:  VITAL SIGNS:  Today, his blood pressure is  144/88, his pulse is 76 and regular, his weight is 243.  HEENT:  Ruddy complexion.  He has a stye under his right eye.  NECK:  No JVD.  Carotid upstrokes were equal bilaterally without bruits.  LUNGS:  Clear to auscultation and percussion.  HEART:  Poorly-appreciated PMI.  Normal S1 and S2.  No gallop.  ABDOMEN:  Protuberant with good bowel sounds.  EXTREMITIES:  No cyanosis or clubbing.  He has trace to 1+ edema.  Pulses were present.  NEURO:  Intact.   ASSESSMENT AND PLAN:  [name] is stable from our standpoint.  His  biggest issue is his therapeutic lifestyle choices, i.e., losing about  30-40 pounds and keeping it off, tight blood sugar control which will  obviously follow with weight loss, and lowering his blood pressure which  also will be held with labile weight loss.  I have advised him to  exercise 3 hours a week on his stationary bike.  To save additional  money, I have asked him to stop his B12 and also his  potassium which has no significant value at this point.  Potassium  supplementation and diet has  been recommended.  He has been given a number of the business office to  find out what happened with the above complaint.  I will see  him back in  6 months.     Jerry C. Daleen Squibb, MD, [facility]  Electronically Signed    TCW/MedQ  DD: 04/10/2008  DT: 04/10/2008  Job #: [id number]

## 2010-09-04 HISTORY — PX: CORONARY ARTERY BYPASS GRAFT: SHX141

## 2010-09-04 HISTORY — PX: CARDIAC CATHETERIZATION: SHX172

## 2010-10-08 LAB — BASIC METABOLIC PANEL
CO2: 22 mEq/L (ref 19–32)
Calcium: 9.4 mg/dL (ref 8.4–10.5)
Calcium: 9.7 mg/dL (ref 8.4–10.5)
GFR calc Af Amer: >60 mL/min (ref >60)
GFR calc Af Amer: >60 mL/min (ref >60)
GFR calc non Af Amer: >60 mL/min (ref >60)
GFR calc non Af Amer: >60 mL/min (ref >60)
Glucose, Bld: 306 mg/dL — ABNORMAL HIGH (ref 70–99)
Potassium: 4.5 mEq/L (ref 3.5–5.1)
Sodium: 137 mEq/L (ref 135–145)
Sodium: 139 mEq/L (ref 135–145)

## 2010-10-08 LAB — CBC
MCHC: 34.8 g/dL (ref 30.0–36.0)
MCV: 90.3 fL (ref 78.0–100.0)
Platelets: 195 10*3/uL (ref 150–400)
RBC: 5.06 MIL/uL (ref 4.22–5.81)
RDW: 13 % (ref 11.5–15.5)

## 2010-10-08 LAB — GLUCOSE, CAPILLARY
Glucose-Capillary: 187 mg/dL — ABNORMAL HIGH (ref 70–99)
Glucose-Capillary: 202 mg/dL — ABNORMAL HIGH (ref 70–99)
Glucose-Capillary: 245 mg/dL — ABNORMAL HIGH (ref 70–99)
Glucose-Capillary: 281 mg/dL — ABNORMAL HIGH (ref 70–99)
Glucose-Capillary: 306 mg/dL — ABNORMAL HIGH (ref 70–99)

## 2010-10-08 LAB — CARDIAC PANEL(CRET KIN+CKTOT+MB+TROPI)
CK, MB: 1.4 ng/mL (ref 0.3–4.0)
Relative Index: INVALID (ref 0.0–2.5)
Relative Index: INVALID (ref 0.0–2.5)
Total CK: 55 U/L (ref 7–232)
Total CK: 65 U/L (ref 7–232)
Troponin I: 0.02 ng/mL (ref 0.00–0.06)
Troponin I: 0.03 ng/mL (ref 0.00–0.06)

## 2010-10-08 LAB — DIFFERENTIAL
Basophils Relative: 0 % (ref 0–1)
Basophils Relative: 0 % (ref 0–1)
Eosinophils Absolute: 0.1 10*3/uL (ref 0.0–0.7)
Eosinophils Absolute: 0.1 10*3/uL (ref 0.0–0.7)
Eosinophils Relative: 2 % (ref 0–5)
Lymphs Abs: 2.1 10*3/uL (ref 0.7–4.0)
Monocytes Absolute: 0.5 10*3/uL (ref 0.1–1.0)
Monocytes Absolute: 0.5 10*3/uL (ref 0.1–1.0)
Monocytes Relative: 8 % (ref 3–12)
Monocytes Relative: 8 % (ref 3–12)
Neutrophils Relative %: 56 % (ref 43–77)

## 2010-10-08 LAB — LIPID PANEL
Cholesterol: 188 mg/dL (ref 0–200)
LDL Cholesterol: 112 mg/dL — ABNORMAL HIGH (ref 0–99)
Total CHOL/HDL Ratio: 10.4 RATIO
Triglycerides: 291 mg/dL — ABNORMAL HIGH (ref <150)
VLDL: 58 mg/dL — ABNORMAL HIGH (ref 0–40)

## 2010-10-08 LAB — COMPREHENSIVE METABOLIC PANEL
ALT: 23 U/L (ref 0–53)
Albumin: 3.7 g/dL (ref 3.5–5.2)
Alkaline Phosphatase: 46 U/L (ref 39–117)
GFR calc Af Amer: >60 mL/min (ref >60)
Potassium: 3.4 mEq/L — ABNORMAL LOW (ref 3.5–5.1)
Sodium: 140 mEq/L (ref 135–145)
Total Protein: 7.2 g/dL (ref 6.0–8.3)

## 2010-10-08 LAB — MAGNESIUM
Magnesium: 1.8 mg/dL (ref 1.5–2.5)
Magnesium: 2 mg/dL (ref 1.5–2.5)

## 2010-10-08 LAB — TSH: TSH: 0.967 u[IU]/mL (ref 0.350–4.500)

## 2010-10-08 LAB — POCT CARDIAC MARKERS

## 2010-12-31 ENCOUNTER — Encounter: Payer: Self-pay | Admitting: Cardiology

## 2011-02-11 ENCOUNTER — Inpatient Hospital Stay (HOSPITAL_COMMUNITY)
Admission: EM | Admit: 2011-02-11 | Discharge: 2011-02-15 | DRG: 493 | Disposition: A | Discharge status: Home / Self Care | Payer: BC Managed Care – PPO | Attending: General Surgery | Admitting: General Surgery

## 2011-02-11 ENCOUNTER — Other Ambulatory Visit: Payer: Self-pay

## 2011-02-11 ENCOUNTER — Emergency Department (HOSPITAL_COMMUNITY): Payer: BC Managed Care – PPO

## 2011-02-11 ENCOUNTER — Encounter (HOSPITAL_COMMUNITY): Payer: Self-pay

## 2011-02-11 DIAGNOSIS — J4489 Other specified chronic obstructive pulmonary disease: Secondary | ICD-10-CM | POA: Diagnosis present

## 2011-02-11 DIAGNOSIS — E119 Type 2 diabetes mellitus without complications: Secondary | ICD-10-CM | POA: Diagnosis present

## 2011-02-11 DIAGNOSIS — I129 Hypertensive chronic kidney disease with stage 1 through stage 4 chronic kidney disease, or unspecified chronic kidney disease: Secondary | ICD-10-CM | POA: Diagnosis present

## 2011-02-11 DIAGNOSIS — Z23 Encounter for immunization: Secondary | ICD-10-CM

## 2011-02-11 DIAGNOSIS — N189 Chronic kidney disease, unspecified: Secondary | ICD-10-CM | POA: Diagnosis present

## 2011-02-11 DIAGNOSIS — Z860100 Personal history of colon polyps, unspecified: Secondary | ICD-10-CM

## 2011-02-11 DIAGNOSIS — E785 Hyperlipidemia, unspecified: Secondary | ICD-10-CM | POA: Diagnosis present

## 2011-02-11 DIAGNOSIS — E669 Obesity, unspecified: Secondary | ICD-10-CM | POA: Diagnosis present

## 2011-02-11 DIAGNOSIS — K810 Acute cholecystitis: Principal | ICD-10-CM | POA: Diagnosis present

## 2011-02-11 DIAGNOSIS — Z7982 Long term (current) use of aspirin: Secondary | ICD-10-CM

## 2011-02-11 DIAGNOSIS — J449 Chronic obstructive pulmonary disease, unspecified: Secondary | ICD-10-CM | POA: Diagnosis present

## 2011-02-11 DIAGNOSIS — I2510 Atherosclerotic heart disease of native coronary artery without angina pectoris: Secondary | ICD-10-CM | POA: Diagnosis present

## 2011-02-11 DIAGNOSIS — I4949 Other premature depolarization: Secondary | ICD-10-CM | POA: Diagnosis present

## 2011-02-11 DIAGNOSIS — Z79899 Other long term (current) drug therapy: Secondary | ICD-10-CM

## 2011-02-11 DIAGNOSIS — Z6832 Body mass index (BMI) 32.0-32.9, adult: Secondary | ICD-10-CM

## 2011-02-11 DIAGNOSIS — K589 Irritable bowel syndrome without diarrhea: Secondary | ICD-10-CM | POA: Diagnosis present

## 2011-02-11 HISTORY — DX: Atherosclerotic heart disease of native coronary artery without angina pectoris: I25.10

## 2011-02-11 HISTORY — DX: Other specified postprocedural states: Z98.890

## 2011-02-11 HISTORY — DX: Chronic kidney disease, unspecified: N18.9

## 2011-02-11 HISTORY — DX: Chronic obstructive pulmonary disease, unspecified: J44.9

## 2011-02-11 HISTORY — DX: Other specified postprocedural states: R11.2

## 2011-02-11 LAB — COMPREHENSIVE METABOLIC PANEL
AST: 56 U/L — ABNORMAL HIGH (ref 0–37)
BUN: 10 mg/dL (ref 6–23)
CO2: 22 mEq/L (ref 19–32)
Calcium: 10 mg/dL (ref 8.4–10.5)
Chloride: 97 mEq/L (ref 96–112)
Creatinine, Ser: 0.66 mg/dL (ref 0.50–1.35)
GFR calc Af Amer: >90 mL/min (ref >90)
GFR calc non Af Amer: >90 mL/min (ref >90)
Total Bilirubin: 0.8 mg/dL (ref 0.3–1.2)

## 2011-02-11 LAB — LIPASE, BLOOD: Lipase: 49 U/L (ref 11–59)

## 2011-02-11 LAB — DIFFERENTIAL
Basophils Absolute: 0 10*3/uL (ref 0.0–0.1)
Eosinophils Relative: 0 % (ref 0–5)
Lymphocytes Relative: 12 % (ref 12–46)
Monocytes Absolute: 1 10*3/uL (ref 0.1–1.0)
Monocytes Relative: 8 % (ref 3–12)

## 2011-02-11 LAB — CBC
HCT: 45.7 % (ref 39.0–52.0)
Hemoglobin: 15.6 g/dL (ref 13.0–17.0)
MCHC: 34.1 g/dL (ref 30.0–36.0)
MCV: 81 fL (ref 78.0–100.0)
RDW: 14.8 % (ref 11.5–15.5)
WBC: 12.1 10*3/uL — ABNORMAL HIGH (ref 4.0–10.5)

## 2011-02-11 LAB — TROPONIN I: Troponin I: <0.3 ng/mL (ref <0.30)

## 2011-02-11 MED ORDER — IOHEXOL 300 MG/ML  SOLN
40.0000 mL | Freq: Once | INTRAMUSCULAR | Status: AC | PRN
  Administered 2011-02-11: 40 mL via ORAL

## 2011-02-11 MED ORDER — INSULIN ASPART 100 UNIT/ML ~~LOC~~ SOLN
0.0000 [IU] | SUBCUTANEOUS | Status: DC
  Administered 2011-02-12 (×2): 5 [IU] via SUBCUTANEOUS
  Administered 2011-02-12 (×2): 3 [IU] via SUBCUTANEOUS
  Administered 2011-02-12: 8 [IU] via SUBCUTANEOUS
  Administered 2011-02-12: 5 [IU] via SUBCUTANEOUS
  Administered 2011-02-13 (×3): 3 [IU] via SUBCUTANEOUS
  Administered 2011-02-13 (×2): 2 [IU] via SUBCUTANEOUS
  Administered 2011-02-13: 3 [IU] via SUBCUTANEOUS
  Administered 2011-02-13: 2 [IU] via SUBCUTANEOUS
  Administered 2011-02-14 (×2): 3 [IU] via SUBCUTANEOUS
  Administered 2011-02-14 – 2011-02-15 (×2): 8 [IU] via SUBCUTANEOUS
  Administered 2011-02-15 (×2): 5 [IU] via SUBCUTANEOUS
  Administered 2011-02-15: 8 [IU] via SUBCUTANEOUS
  Filled 2011-02-11: qty 3

## 2011-02-11 MED ORDER — ONDANSETRON HCL 4 MG/2ML IJ SOLN
4.0000 mg | Freq: Once | INTRAMUSCULAR | Status: AC
  Administered 2011-02-11: 4 mg via INTRAVENOUS
  Filled 2011-02-11: qty 2

## 2011-02-11 MED ORDER — HYDROMORPHONE HCL PF 1 MG/ML IJ SOLN
1.0000 mg | Freq: Once | INTRAMUSCULAR | Status: AC
  Administered 2011-02-11: 1 mg via INTRAVENOUS
  Filled 2011-02-11: qty 1

## 2011-02-11 MED ORDER — METRONIDAZOLE IN NACL 5-0.79 MG/ML-% IV SOLN
500.0000 mg | Freq: Three times a day (TID) | INTRAVENOUS | Status: DC
  Administered 2011-02-11 – 2011-02-15 (×10): 500 mg via INTRAVENOUS
  Filled 2011-02-11 (×15): qty 100

## 2011-02-11 MED ORDER — ENOXAPARIN SODIUM 40 MG/0.4ML ~~LOC~~ SOLN
40.0000 mg | SUBCUTANEOUS | Status: DC
  Administered 2011-02-11 – 2011-02-14 (×4): 40 mg via SUBCUTANEOUS
  Filled 2011-02-11 (×4): qty 0.4

## 2011-02-11 MED ORDER — CIPROFLOXACIN IN D5W 400 MG/200ML IV SOLN
400.0000 mg | Freq: Two times a day (BID) | INTRAVENOUS | Status: DC
  Administered 2011-02-11 – 2011-02-15 (×8): 400 mg via INTRAVENOUS
  Filled 2011-02-11 (×10): qty 200

## 2011-02-11 MED ORDER — METRONIDAZOLE IN NACL 5-0.79 MG/ML-% IV SOLN
INTRAVENOUS | Status: AC
  Filled 2011-02-11: qty 200

## 2011-02-11 MED ORDER — SODIUM CHLORIDE 0.9 % IV SOLN
Freq: Once | INTRAVENOUS | Status: AC
  Administered 2011-02-11: 18:00:00 via INTRAVENOUS

## 2011-02-11 MED ORDER — LACTATED RINGERS IV SOLN
INTRAVENOUS | Status: DC
  Administered 2011-02-11 – 2011-02-14 (×6): via INTRAVENOUS

## 2011-02-11 MED ORDER — IOHEXOL 300 MG/ML  SOLN
100.0000 mL | Freq: Once | INTRAMUSCULAR | Status: AC | PRN
  Administered 2011-02-11: 100 mL via INTRAVENOUS

## 2011-02-11 MED ORDER — PANTOPRAZOLE SODIUM 40 MG IV SOLR
40.0000 mg | Freq: Every day | INTRAVENOUS | Status: DC
  Administered 2011-02-11 – 2011-02-14 (×4): 40 mg via INTRAVENOUS
  Filled 2011-02-11 (×4): qty 40

## 2011-02-11 MED ORDER — ONDANSETRON HCL 4 MG/2ML IJ SOLN
4.0000 mg | Freq: Four times a day (QID) | INTRAMUSCULAR | Status: DC | PRN
  Administered 2011-02-12 – 2011-02-14 (×4): 4 mg via INTRAVENOUS
  Filled 2011-02-11 (×3): qty 2

## 2011-02-11 MED ORDER — METOPROLOL TARTRATE 1 MG/ML IV SOLN
5.0000 mg | Freq: Four times a day (QID) | INTRAVENOUS | Status: DC
  Administered 2011-02-12 – 2011-02-15 (×13): 5 mg via INTRAVENOUS
  Filled 2011-02-11 (×14): qty 5

## 2011-02-11 MED ORDER — HYDROMORPHONE HCL PF 1 MG/ML IJ SOLN
1.0000 mg | INTRAMUSCULAR | Status: DC | PRN
  Administered 2011-02-11 – 2011-02-13 (×3): 2 mg via INTRAVENOUS
  Filled 2011-02-11 (×3): qty 2

## 2011-02-11 MED ORDER — PANTOPRAZOLE SODIUM 40 MG IV SOLR
40.0000 mg | Freq: Once | INTRAVENOUS | Status: AC
  Administered 2011-02-11: 40 mg via INTRAVENOUS
  Filled 2011-02-11: qty 40

## 2011-02-11 NOTE — ED Notes (Signed)
Patient requesting more pain and nausea medication  

## 2011-02-11 NOTE — ED Notes (Signed)
Report received from tiffany, RN 

## 2011-02-11 NOTE — ED Provider Notes (Signed)
This chart was scribed for [employer]. Colon Branch, MD by Williemae Natter. The patient was seen in room APA06/APA06 at 5:18 PM.  CSN: [id number]  Arrival date & time 02/11/11  1640   First MD Initiated Contact with Patient 02/11/11 1704      Chief Complaint  Patient presents with  . Abdominal Pain    (Consider location/radiation/quality/duration/timing/severity/associated sxs/prior treatment) Patient is a 61 y.o. male presenting with abdominal pain. The history is provided by the patient.  Abdominal Pain The primary symptoms of the illness include abdominal pain and nausea. The current episode started 3 to 5 hours ago. The onset of the illness was sudden. The problem has been gradually worsening.  Risk factors for an acute abdominal problem include being elderly. Significant associated medical issues include diabetes.    [name] is a 61 y.o. male with a history of PVC's, diabetes, and coronary bypass who presents to the Emergency Department complaining of acute onset moderate abdominal pain. Pain has been waxing and waning since heart surgery but has been constant since lunchtime. Pt states that the pain radiates from RUQ to right shoulder. Heart surgeries done at [facility]. Pt's last meal was at breakfast. Last bm was this afternoon and was loose and a yellow/green color.  PCP-[facility] Past Medical History  Diagnosis Date  . Hypertension   . Diabetes mellitus   . Colon polyp   . Obese   . PVC's (premature ventricular contractions)   . IBS (irritable bowel syndrome)   . Hyperlipidemia     Past Surgical History  Procedure Date  . Cardiac catheterization   . Cystectomy     Scrotal abscess  . Coronary artery bypass graft     Family History  Problem Relation Age of Onset  . Hypertension Mother   . Emphysema Father   . Heart disease Sister     Enlarged heart    History  Substance Use Topics  . Smoking status: Former Games developer  . Smokeless tobacco: Not on  file   Comment: Smoked 20 years on and off, quit >17 years ago  . 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   10 Systems reviewed and are negative for acute change except as noted in the HPI.  Allergies  Penicillins  Home Medications   Current Outpatient Rx  Name Route Sig Dispense Refill  . ASPIRIN 325 MG PO TBEC Oral Take 325 mg by mouth daily.      Marland Kitchen BENAZEPRIL HCL 10 MG PO TABS Oral Take 10 mg by mouth daily.      Marland Kitchen BENAZEPRIL HCL 20 MG PO TABS Oral Take 20 mg by mouth daily.      Marland Kitchen DICYCLOMINE HCL 20 MG PO TABS Oral Take 20 mg by mouth 4 (four) times daily as needed.      Marland Kitchen ESOMEPRAZOLE MAGNESIUM 40 MG PO CPDR Oral Take 40 mg by mouth daily before breakfast.      . GEMFIBROZIL 600 MG PO TABS Oral Take 600 mg by mouth 2 (two) times daily before a meal.      . MAGNESIUM 250 MG PO TABS Oral Take 1 tablet by mouth daily.      Marland Kitchen METOPROLOL TARTRATE 50 MG PO TABS Oral Take 50 mg by mouth 2 (two) times daily.      Marland Kitchen PIOGLITAZONE HCL  30 MG PO TABS Oral Take 30 mg by mouth daily.      Marland Kitchen CALCIUM POLYCARBOPHIL 625 MG PO TABS Oral Take 1,250 mg by mouth daily.      Marland Kitchen SITAGLIPTIN-METFORMIN HCL 50-1000 MG PO TABS Oral Take 1 tablet by mouth 2 (two) times daily with a meal.        BP 172/82  Pulse 90  Temp(Src) 98.1 F (36.7 C) (Oral)  Resp 19  Ht 5' 10.5" (1.791 m)  Wt 227 lb (102.967 kg)  BMI 32.11 kg/m2  SpO2 97%  Physical Exam  Nursing note and vitals reviewed. Constitutional: He is oriented to person, place, and time. He appears well-developed and well-nourished.  HENT:  Head: Normocephalic and atraumatic.       Mucous membranes dry.  Neck: Normal range of motion. Neck supple.  Cardiovascular: Normal rate, regular rhythm and normal heart sounds.  Exam reveals no friction rub.   No murmur heard.      No clicks Occasional ectopy.  Pulmonary/Chest: Effort  normal. He has rales (with basillar rales bilaterally).  Abdominal: Bowel sounds are normal. There is tenderness. There is guarding. There is no rebound.       Marked RUQ pain with positive for Murphy's sign  Musculoskeletal: He exhibits edema (trace edema).  Neurological: He is alert and oriented to person, place, and time.  Skin: Skin is warm and dry.  Psychiatric: He has a normal mood and affect. His behavior is normal.    ED Course  Procedures (including critical care time) DIAGNOSTIC STUDIES: Oxygen Saturation is 97% on room air, normal by my interpretation.    Date: 02/11/2011  1702  Rate: 89  Rhythm: normal sinus rhythm  QRS Axis: left  Intervals: normal  ST/T Wave abnormalities: nonspecific ST/T changes  Conduction Disutrbances:none  Narrative Interpretation: inferior and anterior infarct, age undetermined  Old EKG Reviewed: changes noted c/w 05/06/2009 LAD, criteria for inferior and anterior infarct now present, St and T wave changes c/w lateral ischemia now present. Results for orders placed during the hospital encounter of 02/11/11  CBC      Component Value Range   WBC 12.1 (*) 4.0 - 10.5 (K/uL)   RBC 5.64  4.22 - 5.81 (MIL/uL)   Hemoglobin 15.6  13.0 - 17.0 (g/dL)   HCT 16.1  09.6 - 04.5 (%)   MCV 81.0  78.0 - 100.0 (fL)   MCH 27.7  26.0 - 34.0 (pg)   MCHC 34.1  30.0 - 36.0 (g/dL)   RDW 40.9  81.1 - 91.4 (%)   Platelets 142 (*) 150 - 400 (K/uL)  DIFFERENTIAL      Component Value Range   Neutrophils Relative 79 (*) 43 - 77 (%)   Neutro Abs 9.6 (*) 1.7 - 7.7 (K/uL)   Lymphocytes Relative 12  12 - 46 (%)   Lymphs Abs 1.5  0.7 - 4.0 (K/uL)   Monocytes Relative 8  3 - 12 (%)   Monocytes Absolute 1.0  0.1 - 1.0 (K/uL)   Eosinophils Relative 0  0 - 5 (%)   Eosinophils Absolute 0.0  0.0 - 0.7 (K/uL)   Basophils Relative 0  0 - 1 (%)   Basophils Absolute 0.0  0.0 - 0.1 (K/uL)  COMPREHENSIVE METABOLIC PANEL      Component Value Range   Sodium 134 (*) 135 - 145 (mEq/L)     Potassium 3.8  3.5 - 5.1 (mEq/L)   Chloride 97  96 - 112 (mEq/L)  CO2 22  19 - 32 (mEq/L)   Glucose, Bld 229 (*) 70 - 99 (mg/dL)   BUN 10  6 - 23 (mg/dL)   Creatinine, Ser 0.98  0.50 - 1.35 (mg/dL)   Calcium 11.9  8.4 - 10.5 (mg/dL)   Total Protein 8.4 (*) 6.0 - 8.3 (g/dL)   Albumin 4.4  3.5 - 5.2 (g/dL)   AST 56 (*) 0 - 37 (U/L)   ALT 44  0 - 53 (U/L)   Alkaline Phosphatase 139 (*) 39 - 117 (U/L)   Total Bilirubin 0.8  0.3 - 1.2 (mg/dL)   GFR calc non Af Amer >90  >90 (mL/min)   GFR calc Af Amer >90  >90 (mL/min)  LIPASE, BLOOD      Component Value Range   Lipase 49  11 - 59 (U/L)  TROPONIN I      Component Value Range   Troponin I <0.30  <0.30 (ng/mL)   COORDINATION OF CARE:  Medications  0.9 %  sodium chloride infusion (not administered)  HYDROmorphone (DILAUDID) injection 1 mg (not administered)  ondansetron (ZOFRAN) injection 4 mg (not administered)  pantoprazole (PROTONIX) injection 40 mg (not administered)   2000 Spoke with Dr. Manson Passey, radiologist who advised contrasted CT will help to image the gallbladder while [place] is the best. 2008 Spoke with Dr. Leticia Penna, surgeon. He will admit patient. Asked that we get the CT abd/pelvis with contrast. Reviewed patient h/o and current problem list. Patient to be admitted to telemetry. Ct Abdomen Pelvis W Contrast  02/11/2011  *RADIOLOGY REPORT*  Clinical Data: Right upper quadrant pain and nausea since noon today.  CT ABDOMEN AND PELVIS WITH CONTRAST  Technique:  Multidetector CT imaging of the abdomen and pelvis was performed following the standard protocol during bolus administration of intravenous contrast.  Contrast: OMNIPAQUE IOHEXOL 300 MG/ML IV SOLN, 40mL OMNIPAQUE IOHEXOL 300 MG/ML IV SOLN  Comparison: 06/05/2009  Findings: Mild dependent atelectasis in the lung bases. Cholelithiasis with layering stones in the gallbladder similar to previous study.  There is a new development of pericholecystic stranding and edema which  might suggest inflammatory process. Acute cholecystitis is not excluded.  The liver, spleen, pancreas, and adrenal glands are unremarkable.  Multiple bilateral intrarenal stones, measuring up to about 7 mm in the lower pole right kidney. There is infiltration in the pararenal fat around the right kidney and proximal right ureter. There is also suggestion of a tiny punctate stone in the right ureteropelvic junction.  Mild pyelocaliectasis.  This may represent partial obstruction.  This could also represent inflammatory process such as pyelonephritis or contiguous inflammation from cholecystitis. No free fluid or free air in the abdomen.  The gastric wall is not thickened.  Free flow of contrast material to the small bowel.  No small bowel dilatation.  Stool filled colon without wall thickening.  Pelvis:  No bladder wall thickening.  The prostate gland is mildly enlarged.  No free or loculated pelvic fluid collections.  No inflammatory changes involving the sigmoid colon.  No significant pelvic lymphadenopathy.  The appendix is normal.  Degenerative changes in the lumbar spine with normal alignment.  IMPRESSION: Cholelithiasis.  Bilateral nonobstructing intrarenal stones. Suggestion of tiny punctate stone in the proximal right ureter with minimal pyelocaliectasis.  Infiltration around the gallbladder and right kidney.  Changes could represent cholecystitis, pyelonephritis, and / or mildly obstructing right renal stone.  Original Report Authenticated By: Marlon Pel, M.D.    MDM  Patient who presents with RUQ  abdominal pain with + Murphy's sign and mild guarding. Labs with elevated WBC and elevated LFTs. CT pending . Admission for acute cholecystitis.Patient has remained afebrile and hemodynamically stable in the ER. The patient appears reasonably stabilized for admission considering the current resources, flow, and capabilities available in the ED at this time, and I doubt any other [facility] requiring further  screening and/or treatment in the ED prior to admission.   I personally performed the services described in this documentation, which was scribed in my presence. The recorded information has been reviewed and considered.  CRITICAL CARE Performed by: Annamarie Dawley.   Total critical care time: 40 Critical care time was exclusive of separately billable procedures and treating other patients.  Critical care was necessary to treat or prevent imminent or life-threatening deterioration.  Critical care was time spent personally by me on the following activities: development of treatment plan with patient and/or surrogate as well as nursing, discussions with consultants, evaluation of patient's response to treatment, examination of patient, obtaining history from patient or surrogate, ordering and performing treatments and interventions, ordering and review of laboratory studies, ordering and review of radiographic studies, pulse oximetry and re-evaluation of patient's condition.      Nicoletta Dress. Colon Branch, MD 02/12/11 1191

## 2011-02-11 NOTE — ED Notes (Signed)
Called Dr. Leticia Penna for consult

## 2011-02-11 NOTE — ED Notes (Signed)
Pt presents with RUQ pain that radiates to right shoulder blade. Pt also reports n/d.

## 2011-02-11 NOTE — ED Notes (Signed)
Patient's monitor alarming, checked patient and monitor, oxygen saturation of 86%, patient sleeping and snoring. Placed patient on 2L of oxygen via nasal canula. Oxygen saturation up to 95% at the moment.

## 2011-02-12 ENCOUNTER — Encounter (HOSPITAL_COMMUNITY): Payer: Self-pay | Admitting: *Deleted

## 2011-02-12 LAB — COMPREHENSIVE METABOLIC PANEL
ALT: 49 U/L (ref 0–53)
AST: 60 U/L — ABNORMAL HIGH (ref 0–37)
CO2: 27 mEq/L (ref 19–32)
Chloride: 98 mEq/L (ref 96–112)
Creatinine, Ser: 0.91 mg/dL (ref 0.50–1.35)
GFR calc Af Amer: >90 mL/min (ref >90)
GFR calc non Af Amer: >90 mL/min (ref >90)
Glucose, Bld: 220 mg/dL — ABNORMAL HIGH (ref 70–99)
Sodium: 134 mEq/L — ABNORMAL LOW (ref 135–145)
Total Bilirubin: 1.2 mg/dL (ref 0.3–1.2)

## 2011-02-12 LAB — GLUCOSE, CAPILLARY
Glucose-Capillary: 192 mg/dL — ABNORMAL HIGH (ref 70–99)
Glucose-Capillary: 221 mg/dL — ABNORMAL HIGH (ref 70–99)
Glucose-Capillary: 224 mg/dL — ABNORMAL HIGH (ref 70–99)
Glucose-Capillary: 262 mg/dL — ABNORMAL HIGH (ref 70–99)

## 2011-02-12 LAB — CBC
MCH: 27 pg (ref 26.0–34.0)
MCHC: 32.8 g/dL (ref 30.0–36.0)
Platelets: 129 10*3/uL — ABNORMAL LOW (ref 150–400)
RDW: 15.2 % (ref 11.5–15.5)

## 2011-02-12 NOTE — H&P (Signed)
Jerry Williams is an 61 y.o. male.   Chief Complaint: RUQ pain HPI: Patient was awoken from sleep yesterday morning about 4AM with upper abdominal pain.  He has had similar symptoms in the past but never this severe.  Has had associated nausea and non-bloody emesis.  + diarrhea.  No melena.  No family history of biliary disease.  No jaundice.  Gets bloated and gassy with most foods even water.  Pain does radiate to Right shoulder.  Pain described as colicky in nature.  Past Medical History  Diagnosis Date  . Hypertension   . Diabetes mellitus   . Colon polyp   . Obese   . PVC's (premature ventricular contractions)   . IBS (irritable bowel syndrome)   . Hyperlipidemia   . PONV (postoperative nausea and vomiting)   . Chronic kidney disease   . Coronary artery disease   . COPD (chronic obstructive pulmonary disease)     Past Surgical History  Procedure Date  . Cardiac catheterization   . Cystectomy     Scrotal abscess  . Coronary artery bypass graft     Family History  Problem Relation Age of Onset  . Hypertension Mother   . Emphysema Father   . Heart disease Sister     Enlarged heart   Social History:  reports that he has quit smoking. He does not have any smokeless tobacco history on file. He reports that he does not drink alcohol or use illicit drugs.  Allergies:  Allergies  Allergen Reactions  . Penicillins     Childhood allergy: states ineffective    Medications Prior to Admission  Medication Dose Route Frequency Provider Last Rate Last Dose  . 0.9 %  sodium chloride infusion   Intravenous Once [employer]. Colon Branch, MD      . ciprofloxacin (CIPRO) IVPB 400 mg  400 mg Intravenous Q12H Fabio Bering, MD   400 mg at 02/12/11 0919  . enoxaparin (LOVENOX) injection 40 mg  40 mg Subcutaneous Q24H Fabio Bering, MD   40 mg at 02/11/11 2321  . HYDROmorphone (DILAUDID) injection 1 mg  1 mg Intravenous Once [employer]. Colon Branch, MD   1 mg at 02/11/11 1753  . HYDROmorphone  (DILAUDID) injection 1 mg  1 mg Intravenous Once [employer]. Colon Branch, MD   1 mg at 02/11/11 1935  . HYDROmorphone (DILAUDID) injection 1-2 mg  1-2 mg Intravenous Q4H PRN Fabio Bering, MD   2 mg at 02/12/11 0919  . insulin aspart (novoLOG) injection 0-15 Units  0-15 Units Subcutaneous Q4H Fabio Bering, MD   8 Units at 02/12/11 1700  . iohexol (OMNIPAQUE) 300 MG/ML solution 100 mL  100 mL Intravenous Once PRN Medication Radiologist, MD   100 mL at 02/11/11 2101  . iohexol (OMNIPAQUE) 300 MG/ML solution 40 mL  40 mL Oral Once PRN Medication Radiologist, MD   40 mL at 02/11/11 2103  . lactated ringers infusion   Intravenous Continuous Fabio Bering, MD 100 mL/hr at 02/12/11 1342    . metoprolol (LOPRESSOR) injection 5 mg  5 mg Intravenous Q6H Fabio Bering, MD   5 mg at 02/12/11 1700  . metroNIDAZOLE (FLAGYL) IVPB 500 mg  500 mg Intravenous Q8H Fabio Bering, MD   500 mg at 02/12/11 1343  . ondansetron (ZOFRAN) injection 4 mg  4 mg Intravenous Once [employer]. Colon Branch, MD   4 mg at 02/11/11 1753  . ondansetron (ZOFRAN) injection 4 mg  4 mg  Intravenous Once [employer]. Colon Branch, MD   4 mg at 02/11/11 1935  . ondansetron (ZOFRAN) injection 4 mg  4 mg Intravenous Q6H PRN Fabio Bering, MD   4 mg at 02/12/11 1308  . pantoprazole (PROTONIX) injection 40 mg  40 mg Intravenous Once [employer]. Colon Branch, MD   40 mg at 02/11/11 1753  . pantoprazole (PROTONIX) injection 40 mg  40 mg Intravenous QHS Fabio Bering, MD   40 mg at 02/11/11 2321   Medications Prior to Admission  Medication Sig Dispense Refill  . aspirin 325 MG EC tablet Take 325 mg by mouth every morning.       . dicyclomine (BENTYL) 20 MG tablet Take 20 mg by mouth 4 (four) times daily as needed. For stomach      . esomeprazole (NEXIUM) 40 MG capsule Take 40 mg by mouth daily with supper. At 6pm      . Magnesium 250 MG TABS Take 1 tablet by mouth daily.        . metoprolol (LOPRESSOR) 50 MG tablet Take 50 mg by mouth 2 (two) times daily. **Take 150  mg by mouth twice daily. Take one 50 mg  Tablet with one 100 mg tablet to total 150 mg twice daily**      . polycarbophil (FIBERCON) 625 MG tablet Take 1,250 mg by mouth daily with supper. At 6pm      . sitaGLIPtan-metformin (JANUMET) 50-1000 MG per tablet Take 1 tablet by mouth 2 (two) times daily with a meal.          Results for orders placed during the hospital encounter of 02/11/11 (from the past 48 hour(s))  CBC     Status: Abnormal   Collection Time   02/11/11  5:25 PM      Component Value Range Comment   WBC 12.1 (*) 4.0 - 10.5 (K/uL)    RBC 5.64  4.22 - 5.81 (MIL/uL)    Hemoglobin 15.6  13.0 - 17.0 (g/dL)    HCT 08.6  57.8 - 46.9 (%)    MCV 81.0  78.0 - 100.0 (fL)    MCH 27.7  26.0 - 34.0 (pg)    MCHC 34.1  30.0 - 36.0 (g/dL)    RDW 62.9  52.8 - 41.3 (%)    Platelets 142 (*) 150 - 400 (K/uL)   DIFFERENTIAL     Status: Abnormal   Collection Time   02/11/11  5:25 PM      Component Value Range Comment   Neutrophils Relative 79 (*) 43 - 77 (%)    Neutro Abs 9.6 (*) 1.7 - 7.7 (K/uL)    Lymphocytes Relative 12  12 - 46 (%)    Lymphs Abs 1.5  0.7 - 4.0 (K/uL)    Monocytes Relative 8  3 - 12 (%)    Monocytes Absolute 1.0  0.1 - 1.0 (K/uL)    Eosinophils Relative 0  0 - 5 (%)    Eosinophils Absolute 0.0  0.0 - 0.7 (K/uL)    Basophils Relative 0  0 - 1 (%)    Basophils Absolute 0.0  0.0 - 0.1 (K/uL)   COMPREHENSIVE METABOLIC PANEL     Status: Abnormal   Collection Time   02/11/11  5:25 PM      Component Value Range Comment   Sodium 134 (*) 135 - 145 (mEq/L)    Potassium 3.8  3.5 - 5.1 (mEq/L)    Chloride 97  96 - 112 (mEq/L)  CO2 22  19 - 32 (mEq/L)    Glucose, Bld 229 (*) 70 - 99 (mg/dL)    BUN 10  6 - 23 (mg/dL)    Creatinine, Ser 4.09  0.50 - 1.35 (mg/dL)    Calcium 81.1  8.4 - 10.5 (mg/dL)    Total Protein 8.4 (*) 6.0 - 8.3 (g/dL)    Albumin 4.4  3.5 - 5.2 (g/dL)    AST 56 (*) 0 - 37 (U/L)    ALT 44  0 - 53 (U/L)    Alkaline Phosphatase 139 (*) 39 - 117 (U/L)    Total  Bilirubin 0.8  0.3 - 1.2 (mg/dL)    GFR calc non Af Amer >90  >90 (mL/min)    GFR calc Af Amer >90  >90 (mL/min)   LIPASE, BLOOD     Status: Normal   Collection Time   02/11/11  5:25 PM      Component Value Range Comment   Lipase 49  11 - 59 (U/L)   TROPONIN I     Status: Normal   Collection Time   02/11/11  5:25 PM      Component Value Range Comment   Troponin I <0.30  <0.30 (ng/mL)   GLUCOSE, CAPILLARY     Status: Abnormal   Collection Time   02/12/11 12:17 AM      Component Value Range Comment   Glucose-Capillary 224 (*) 70 - 99 (mg/dL)    Comment 1 Notify RN     MRSA PCR SCREENING     Status: Normal   Collection Time   02/12/11 12:59 AM      Component Value Range Comment   MRSA by PCR NEGATIVE  NEGATIVE    GLUCOSE, CAPILLARY     Status: Abnormal   Collection Time   02/12/11  4:42 AM      Component Value Range Comment   Glucose-Capillary 205 (*) 70 - 99 (mg/dL)    Comment 1 Notify RN     COMPREHENSIVE METABOLIC PANEL     Status: Abnormal   Collection Time   02/12/11  6:30 AM      Component Value Range Comment   Sodium 134 (*) 135 - 145 (mEq/L)    Potassium 4.3  3.5 - 5.1 (mEq/L)    Chloride 98  96 - 112 (mEq/L)    CO2 27  19 - 32 (mEq/L)    Glucose, Bld 220 (*) 70 - 99 (mg/dL)    BUN 9  6 - 23 (mg/dL)    Creatinine, Ser 9.14  0.50 - 1.35 (mg/dL)    Calcium 9.2  8.4 - 10.5 (mg/dL)    Total Protein 7.0  6.0 - 8.3 (g/dL)    Albumin 3.4 (*) 3.5 - 5.2 (g/dL)    AST 60 (*) 0 - 37 (U/L)    ALT 49  0 - 53 (U/L)    Alkaline Phosphatase 114  39 - 117 (U/L)    Total Bilirubin 1.2  0.3 - 1.2 (mg/dL)    GFR calc non Af Amer >90  >90 (mL/min)    GFR calc Af Amer >90  >90 (mL/min)   CBC     Status: Abnormal   Collection Time   02/12/11  6:30 AM      Component Value Range Comment   WBC 14.5 (*) 4.0 - 10.5 (K/uL)    RBC 5.37  4.22 - 5.81 (MIL/uL)    Hemoglobin 14.5  13.0 - 17.0 (g/dL)    HCT  44.2  39.0 - 52.0 (%)    MCV 82.3  78.0 - 100.0 (fL)    MCH 27.0  26.0 - 34.0 (pg)    MCHC  32.8  30.0 - 36.0 (g/dL)    RDW 24.4  01.0 - 27.2 (%)    Platelets 129 (*) 150 - 400 (K/uL)   GLUCOSE, CAPILLARY     Status: Abnormal   Collection Time   02/12/11  8:04 AM      Component Value Range Comment   Glucose-Capillary 186 (*) 70 - 99 (mg/dL)    Comment 1 Notify RN     GLUCOSE, CAPILLARY     Status: Abnormal   Collection Time   02/12/11 12:16 PM      Component Value Range Comment   Glucose-Capillary 221 (*) 70 - 99 (mg/dL)    Comment 1 Notify RN     GLUCOSE, CAPILLARY     Status: Abnormal   Collection Time   02/12/11  4:52 PM      Component Value Range Comment   Glucose-Capillary 262 (*) 70 - 99 (mg/dL)    Ct Abdomen Pelvis W Contrast  02/11/2011  *RADIOLOGY REPORT*  Clinical Data: Right upper quadrant pain and nausea since noon today.  CT ABDOMEN AND PELVIS WITH CONTRAST  Technique:  Multidetector CT imaging of the abdomen and pelvis was performed following the standard protocol during bolus administration of intravenous contrast.  Contrast: OMNIPAQUE IOHEXOL 300 MG/ML IV SOLN, 40mL OMNIPAQUE IOHEXOL 300 MG/ML IV SOLN  Comparison: 06/05/2009  Findings: Mild dependent atelectasis in the lung bases. Cholelithiasis with layering stones in the gallbladder similar to previous study.  There is a new development of pericholecystic stranding and edema which might suggest inflammatory process. Acute cholecystitis is not excluded.  The liver, spleen, pancreas, and adrenal glands are unremarkable.  Multiple bilateral intrarenal stones, measuring up to about 7 mm in the lower pole right kidney. There is infiltration in the pararenal fat around the right kidney and proximal right ureter. There is also suggestion of a tiny punctate stone in the right ureteropelvic junction.  Mild pyelocaliectasis.  This may represent partial obstruction.  This could also represent inflammatory process such as pyelonephritis or contiguous inflammation from cholecystitis. No free fluid or free air in the abdomen.  The  gastric wall is not thickened.  Free flow of contrast material to the small bowel.  No small bowel dilatation.  Stool filled colon without wall thickening.  Pelvis:  No bladder wall thickening.  The prostate gland is mildly enlarged.  No free or loculated pelvic fluid collections.  No inflammatory changes involving the sigmoid colon.  No significant pelvic lymphadenopathy.  The appendix is normal.  Degenerative changes in the lumbar spine with normal alignment.  IMPRESSION: Cholelithiasis.  Bilateral nonobstructing intrarenal stones. Suggestion of tiny punctate stone in the proximal right ureter with minimal pyelocaliectasis.  Infiltration around the gallbladder and right kidney.  Changes could represent cholecystitis, pyelonephritis, and / or mildly obstructing right renal stone.  Original Report Authenticated By: Marlon Pel, M.D.    Review of Systems  Constitutional: Positive for fever and chills. Negative for weight loss, malaise/fatigue and diaphoresis.  HENT: Negative.   Eyes: Negative.   Respiratory: Negative.   Cardiovascular: Positive for chest pain. Negative for palpitations, orthopnea and claudication.  Gastrointestinal: Positive for heartburn, nausea, vomiting, abdominal pain (Right greater than left upper quadrant) and diarrhea. Negative for constipation, blood in stool and melena.  Genitourinary: Negative.   Musculoskeletal: Negative.  Skin: Negative.   Neurological: Negative.  Negative for weakness.  Endo/Heme/Allergies: Negative.   Psychiatric/Behavioral: Negative.     Blood pressure 131/73, pulse 85, temperature 98 F (36.7 C), temperature source Oral, resp. rate 18, height 5\' 11"  (1.803 m), weight 102 kg (224 lb 13.9 oz), SpO2 93.00%. Physical Exam  Constitutional: He is oriented to person, place, and time. He appears well-developed and well-nourished. No distress.       Moderately obese  HENT:  Head: Normocephalic and atraumatic.  Eyes: Conjunctivae and EOM are  normal. Pupils are equal, round, and reactive to light. No scleral icterus.  Neck: Normal range of motion. Neck supple. No tracheal deviation present. No thyromegaly present.  Cardiovascular: Normal rate, regular rhythm and normal heart sounds.   Respiratory: Effort normal and breath sounds normal. No respiratory distress. He has no wheezes.  GI: Soft. He exhibits distension. He exhibits no mass. There is tenderness (RUQ.  +Murphy's sign.  No diffuse peritoneal signs.  ). There is no rebound and no guarding.  Lymphadenopathy:    He has no cervical adenopathy.  Neurological: He is alert and oriented to person, place, and time.  Skin: Skin is warm and dry.     Assessment/Plan Acute cholecystitis.  Admit for IVF, emperic antibiotics.  Bowel rest.  Options discussed with patient.  Risks, benefits, and alternatives discussed with patient and wife.  Will plan to proceed to the OR for laparoscopic chole on Monday.    Lynora Dymond C 02/12/2011, 5:36 PM

## 2011-02-13 ENCOUNTER — Other Ambulatory Visit: Payer: Self-pay

## 2011-02-13 LAB — GLUCOSE, CAPILLARY
Glucose-Capillary: 146 mg/dL — ABNORMAL HIGH (ref 70–99)
Glucose-Capillary: 179 mg/dL — ABNORMAL HIGH (ref 70–99)
Glucose-Capillary: 163 mg/dL — ABNORMAL HIGH (ref 70–99)

## 2011-02-13 LAB — CBC
Hemoglobin: 13.2 g/dL (ref 13.0–17.0)
MCH: 26.8 pg (ref 26.0–34.0)
MCHC: 32.4 g/dL (ref 30.0–36.0)

## 2011-02-13 LAB — BASIC METABOLIC PANEL
Calcium: 8.9 mg/dL (ref 8.4–10.5)
GFR calc non Af Amer: >90 mL/min (ref >90)
Glucose, Bld: 236 mg/dL — ABNORMAL HIGH (ref 70–99)
Sodium: 135 mEq/L (ref 135–145)

## 2011-02-13 LAB — SURGICAL PCR SCREEN: MRSA, PCR: NEGATIVE

## 2011-02-13 NOTE — Progress Notes (Signed)
Subjective: Pain somewhat better.  No SOB, no CP.  No fever or chills.    Objective: Vital signs in last 24 hours: Temp:  [97.8 F (36.6 C)-98.9 F (37.2 C)] 98 F (36.7 C) (02/10 1404) Pulse Rate:  [60-97] 60  (02/10 1404) Resp:  [18-19] 18  (02/10 1404) BP: (113-162)/(61-82) 162/82 mmHg (02/10 1404) SpO2:  [93 %-96 %] 93 % (02/10 1404) Last BM Date: 02/12/11  Intake/Output from previous day: 02/09 0701 - 02/10 0700 In: 4711.7 [P.O.:580; I.V.:3031.7; IV Piggyback:1100] Out: 1101 [Urine:1100; Emesis/NG output:1] Intake/Output this shift: Total I/O In: 240 [P.O.:240] Out: -   General appearance: alert and no distress GI: soft, mild distention, moderate RUQ pain.  No diffuse peritoneal signs.    Lab Results:   Basename 02/13/11 0920 02/12/11 0630  WBC 10.4 14.5*  HGB 13.2 14.5  HCT 40.7 44.2  PLT 125* 129*   BMET  Basename 02/13/11 0920 02/12/11 0630  NA 135 134*  K 3.7 4.3  CL 100 98  CO2 25 27  GLUCOSE 236* 220*  BUN 8 9  CREATININE 0.81 0.91  CALCIUM 8.9 9.2   PT/INR No results found for this basename: LABPROT:2,INR:2 in the last 72 hours ABG No results found for this basename: PHART:2,PCO2:2,PO2:2,HCO3:2 in the last 72 hours  Studies/Results: Ct Abdomen Pelvis W Contrast  02/11/2011  *RADIOLOGY REPORT*  Clinical Data: Right upper quadrant pain and nausea since noon today.  CT ABDOMEN AND PELVIS WITH CONTRAST  Technique:  Multidetector CT imaging of the abdomen and pelvis was performed following the standard protocol during bolus administration of intravenous contrast.  Contrast: OMNIPAQUE IOHEXOL 300 MG/ML IV SOLN, 40mL OMNIPAQUE IOHEXOL 300 MG/ML IV SOLN  Comparison: 06/05/2009  Findings: Mild dependent atelectasis in the lung bases. Cholelithiasis with layering stones in the gallbladder similar to previous study.  There is a new development of pericholecystic stranding and edema which might suggest inflammatory process. Acute cholecystitis is not  excluded.  The liver, spleen, pancreas, and adrenal glands are unremarkable.  Multiple bilateral intrarenal stones, measuring up to about 7 mm in the lower pole right kidney. There is infiltration in the pararenal fat around the right kidney and proximal right ureter. There is also suggestion of a tiny punctate stone in the right ureteropelvic junction.  Mild pyelocaliectasis.  This may represent partial obstruction.  This could also represent inflammatory process such as pyelonephritis or contiguous inflammation from cholecystitis. No free fluid or free air in the abdomen.  The gastric wall is not thickened.  Free flow of contrast material to the small bowel.  No small bowel dilatation.  Stool filled colon without wall thickening.  Pelvis:  No bladder wall thickening.  The prostate gland is mildly enlarged.  No free or loculated pelvic fluid collections.  No inflammatory changes involving the sigmoid colon.  No significant pelvic lymphadenopathy.  The appendix is normal.  Degenerative changes in the lumbar spine with normal alignment.  IMPRESSION: Cholelithiasis.  Bilateral nonobstructing intrarenal stones. Suggestion of tiny punctate stone in the proximal right ureter with minimal pyelocaliectasis.  Infiltration around the gallbladder and right kidney.  Changes could represent cholecystitis, pyelonephritis, and / or mildly obstructing right renal stone.  Original Report Authenticated By: Marlon Pel, M.D.    Anti-infectives: Anti-infectives     Start     Dose/Rate Route Frequency Ordered Stop   02/11/11 2200   metroNIDAZOLE (FLAGYL) IVPB 500 mg        500 mg 100 mL/hr over 60 Minutes  Intravenous Every 8 hours 02/11/11 2016     02/11/11 2100   ciprofloxacin (CIPRO) IVPB 400 mg        400 mg 200 mL/hr over 60 Minutes Intravenous Every 12 hours 02/11/11 2016            Assessment/Plan: s/p  LAPAROSCOPIC CHOLECYSTECTOMY Acute chole.  We will plan to proceed to the OR tomorrow.  Patient's  questions addressed.  LOS: 2 days    Maddon Horton C 02/13/2011

## 2011-02-14 ENCOUNTER — Encounter (HOSPITAL_COMMUNITY): Payer: Self-pay | Admitting: Anesthesiology

## 2011-02-14 ENCOUNTER — Encounter (HOSPITAL_COMMUNITY)
Admission: EM | Disposition: A | Discharge status: Home / Self Care | Payer: Self-pay | Source: Home / Self Care | Attending: General Surgery

## 2011-02-14 ENCOUNTER — Inpatient Hospital Stay (HOSPITAL_COMMUNITY): Payer: BC Managed Care – PPO | Admitting: Anesthesiology

## 2011-02-14 ENCOUNTER — Other Ambulatory Visit: Payer: Self-pay | Admitting: General Surgery

## 2011-02-14 HISTORY — PX: CHOLECYSTECTOMY: SHX55

## 2011-02-14 LAB — GLUCOSE, CAPILLARY
Glucose-Capillary: 168 mg/dL — ABNORMAL HIGH (ref 70–99)
Glucose-Capillary: 151 mg/dL — ABNORMAL HIGH (ref 70–99)
Glucose-Capillary: 231 mg/dL — ABNORMAL HIGH (ref 70–99)
Glucose-Capillary: 264 mg/dL — ABNORMAL HIGH (ref 70–99)

## 2011-02-14 LAB — CBC
HCT: 40.6 % (ref 39.0–52.0)
Hemoglobin: 13.1 g/dL (ref 13.0–17.0)
MCH: 26.5 pg (ref 26.0–34.0)
MCHC: 32.3 g/dL (ref 30.0–36.0)

## 2011-02-14 LAB — BASIC METABOLIC PANEL
BUN: 6 mg/dL (ref 6–23)
Calcium: 8.9 mg/dL (ref 8.4–10.5)
Creatinine, Ser: 0.8 mg/dL (ref 0.50–1.35)
GFR calc non Af Amer: >90 mL/min (ref >90)
Glucose, Bld: 174 mg/dL — ABNORMAL HIGH (ref 70–99)
Sodium: 138 mEq/L (ref 135–145)

## 2011-02-14 SURGERY — LAPAROSCOPIC CHOLECYSTECTOMY
Anesthesia: General | Wound class: Contaminated

## 2011-02-14 MED ORDER — ONDANSETRON HCL 4 MG/2ML IJ SOLN: 4.0000 mg | Freq: Once | INTRAMUSCULAR | Status: DC

## 2011-02-14 MED ORDER — SODIUM CHLORIDE 0.9 % IR SOLN
Status: DC | PRN
  Administered 2011-02-14: 1000 mL

## 2011-02-14 MED ORDER — FENTANYL CITRATE 0.05 MG/ML IJ SOLN
INTRAMUSCULAR | Status: AC
  Administered 2011-02-14: 25 ug via INTRAVENOUS
  Filled 2011-02-14: qty 2

## 2011-02-14 MED ORDER — POTASSIUM CHLORIDE 10 MEQ/100ML IV SOLN: 10.0000 meq | INTRAVENOUS | Status: AC

## 2011-02-14 MED ORDER — HYDROMORPHONE HCL PF 1 MG/ML IJ SOLN
1.0000 mg | INTRAMUSCULAR | Status: DC | PRN
  Administered 2011-02-14: 1 mg via INTRAVENOUS
  Filled 2011-02-14: qty 1

## 2011-02-14 MED ORDER — FENTANYL CITRATE 0.05 MG/ML IJ SOLN
INTRAMUSCULAR | Status: DC | PRN
  Administered 2011-02-14 (×3): 50 ug via INTRAVENOUS
  Administered 2011-02-14: 150 ug via INTRAVENOUS

## 2011-02-14 MED ORDER — FENTANYL CITRATE 0.05 MG/ML IJ SOLN
25.0000 ug | INTRAMUSCULAR | Status: DC | PRN
  Administered 2011-02-14 (×2): 25 ug via INTRAVENOUS
  Administered 2011-02-14 (×3): 50 ug via INTRAVENOUS

## 2011-02-14 MED ORDER — MIDAZOLAM HCL 2 MG/2ML IJ SOLN
INTRAMUSCULAR | Status: AC
  Administered 2011-02-14: 2 mg via INTRAVENOUS
  Filled 2011-02-14: qty 2

## 2011-02-14 MED ORDER — ONDANSETRON HCL 4 MG/2ML IJ SOLN: 4.0000 mg | Freq: Once | INTRAMUSCULAR | Status: DC | PRN

## 2011-02-14 MED ORDER — ROCURONIUM BROMIDE 100 MG/10ML IV SOLN
INTRAVENOUS | Status: DC | PRN
  Administered 2011-02-14: 40 mg via INTRAVENOUS

## 2011-02-14 MED ORDER — GLYCOPYRROLATE 0.2 MG/ML IJ SOLN
INTRAMUSCULAR | Status: DC | PRN
  Administered 2011-02-14: .6 mg via INTRAVENOUS

## 2011-02-14 MED ORDER — LACTATED RINGERS IV SOLN: INTRAVENOUS | Status: DC

## 2011-02-14 MED ORDER — DEXAMETHASONE SODIUM PHOSPHATE 4 MG/ML IJ SOLN
INTRAMUSCULAR | Status: AC
  Administered 2011-02-14: 4 mg via INTRAVENOUS
  Filled 2011-02-14: qty 1

## 2011-02-14 MED ORDER — PROPOFOL 10 MG/ML IV EMUL
INTRAVENOUS | Status: DC | PRN
  Administered 2011-02-14: 180 mg via INTRAVENOUS
  Administered 2011-02-14: 20 mg via INTRAVENOUS

## 2011-02-14 MED ORDER — HEMOSTATIC AGENTS (NO CHARGE) OPTIME
TOPICAL | Status: DC | PRN
  Administered 2011-02-14: 1 via TOPICAL

## 2011-02-14 MED ORDER — VECURONIUM BROMIDE 10 MG IV SOLR
INTRAVENOUS | Status: AC
  Filled 2011-02-14: qty 10

## 2011-02-14 MED ORDER — PNEUMOCOCCAL VAC POLYVALENT 25 MCG/0.5ML IJ INJ
0.5000 mL | INJECTION | INTRAMUSCULAR | Status: AC
  Administered 2011-02-15: 0.5 mL via INTRAMUSCULAR
  Filled 2011-02-14: qty 0.5

## 2011-02-14 MED ORDER — MIDAZOLAM HCL 2 MG/2ML IJ SOLN
1.0000 mg | INTRAMUSCULAR | Status: DC | PRN
  Administered 2011-02-14: 2 mg via INTRAVENOUS

## 2011-02-14 MED ORDER — LABETALOL HCL 5 MG/ML IV SOLN
INTRAVENOUS | Status: DC | PRN
  Administered 2011-02-14: 5 mg via INTRAVENOUS

## 2011-02-14 MED ORDER — FENTANYL CITRATE 0.05 MG/ML IJ SOLN
INTRAMUSCULAR | Status: AC
  Administered 2011-02-14: 50 ug via INTRAVENOUS
  Filled 2011-02-14: qty 2

## 2011-02-14 MED ORDER — INFLUENZA VIRUS VACC SPLIT PF IM SUSP
0.5000 mL | INTRAMUSCULAR | Status: AC
  Administered 2011-02-15: 0.5 mL via INTRAMUSCULAR
  Filled 2011-02-14: qty 0.5

## 2011-02-14 MED ORDER — FENTANYL CITRATE 0.05 MG/ML IJ SOLN
INTRAMUSCULAR | Status: AC
  Administered 2011-02-14: 50 ug via INTRAVENOUS
  Filled 2011-02-14: qty 5

## 2011-02-14 MED ORDER — NEOSTIGMINE METHYLSULFATE 1 MG/ML IJ SOLN
INTRAMUSCULAR | Status: AC
  Filled 2011-02-14: qty 10

## 2011-02-14 MED ORDER — HYDROCODONE-ACETAMINOPHEN 5-325 MG PO TABS
1.0000 | ORAL_TABLET | ORAL | Status: DC | PRN
  Administered 2011-02-14 – 2011-02-15 (×3): 2 via ORAL
  Filled 2011-02-14 (×3): qty 2

## 2011-02-14 MED ORDER — GLYCOPYRROLATE 0.2 MG/ML IJ SOLN
INTRAMUSCULAR | Status: AC
  Filled 2011-02-14: qty 2

## 2011-02-14 MED ORDER — CELECOXIB 100 MG PO CAPS
200.0000 mg | ORAL_CAPSULE | Freq: Two times a day (BID) | ORAL | Status: DC
  Administered 2011-02-14 (×2): 200 mg via ORAL
  Filled 2011-02-14 (×4): qty 1

## 2011-02-14 MED ORDER — LACTATED RINGERS IV SOLN
INTRAVENOUS | Status: DC | PRN
  Administered 2011-02-14 (×2): via INTRAVENOUS
  Administered 2011-02-14: 1000 mL

## 2011-02-14 MED ORDER — SODIUM CHLORIDE 0.9 % IJ SOLN
INTRAMUSCULAR | Status: AC
  Administered 2011-02-14: 23:00:00
  Filled 2011-02-14: qty 3

## 2011-02-14 MED ORDER — BUPIVACAINE HCL (PF) 0.5 % IJ SOLN
INTRAMUSCULAR | Status: DC | PRN
  Administered 2011-02-14: 10 mL

## 2011-02-14 MED ORDER — ONDANSETRON HCL 4 MG/2ML IJ SOLN
INTRAMUSCULAR | Status: DC | PRN
  Administered 2011-02-14: 4 mg via INTRAVENOUS

## 2011-02-14 MED ORDER — PROPOFOL 10 MG/ML IV EMUL
INTRAVENOUS | Status: AC
  Filled 2011-02-14: qty 20

## 2011-02-14 MED ORDER — BUPIVACAINE HCL (PF) 0.5 % IJ SOLN
INTRAMUSCULAR | Status: AC
  Filled 2011-02-14: qty 30

## 2011-02-14 MED ORDER — NEOSTIGMINE METHYLSULFATE 1 MG/ML IJ SOLN
INTRAMUSCULAR | Status: DC | PRN
  Administered 2011-02-14: 4 mg via INTRAVENOUS

## 2011-02-14 MED ORDER — ONDANSETRON HCL 4 MG/2ML IJ SOLN
INTRAMUSCULAR | Status: AC
  Administered 2011-02-14: 4 mg via INTRAVENOUS
  Filled 2011-02-14: qty 2

## 2011-02-14 MED ORDER — DEXAMETHASONE SODIUM PHOSPHATE 4 MG/ML IJ SOLN
4.0000 mg | Freq: Once | INTRAMUSCULAR | Status: AC
  Administered 2011-02-14: 4 mg via INTRAVENOUS

## 2011-02-14 MED ORDER — ONDANSETRON HCL 4 MG/2ML IJ SOLN
INTRAMUSCULAR | Status: AC
  Filled 2011-02-14: qty 2

## 2011-02-14 SURGICAL SUPPLY — 47 items
APL SKNCLS STERI-STRIP NONHPOA (GAUZE/BANDAGES/DRESSINGS)
APPLIER CLIP UNV 5X34 EPIX (ENDOMECHANICALS) ×2 IMPLANT
APR XCLPCLP 20M/L UNV 34X5 (ENDOMECHANICALS) ×1
BAG HAMPER (MISCELLANEOUS) ×2 IMPLANT
BAG SPEC RTRVL LRG 6X4 10 (ENDOMECHANICALS) ×1
BENZOIN TINCTURE PRP APPL 2/3 (GAUZE/BANDAGES/DRESSINGS) ×1 IMPLANT
CLOTH BEACON ORANGE TIMEOUT ST (SAFETY) ×2 IMPLANT
COVER LIGHT HANDLE STERIS (MISCELLANEOUS) ×4 IMPLANT
CUTTER LINEAR ENDO 35 ETS TH (STAPLE) ×1 IMPLANT
DECANTER SPIKE VIAL GLASS SM (MISCELLANEOUS) ×2 IMPLANT
DEVICE TROCAR PUNCTURE CLOSURE (ENDOMECHANICALS) ×2 IMPLANT
DURAPREP 26ML APPLICATOR (WOUND CARE) ×2 IMPLANT
ELECT REM PT RETURN 9FT ADLT (ELECTROSURGICAL) ×2
ELECTRODE REM PT RTRN 9FT ADLT (ELECTROSURGICAL) ×1 IMPLANT
EVACUATOR DRAINAGE 10X20 100CC (DRAIN) IMPLANT
EVACUATOR SILICONE 100CC (DRAIN) ×2
FILTER SMOKE EVAC LAPAROSHD (FILTER) ×2 IMPLANT
FORMALIN 10 PREFIL 120ML (MISCELLANEOUS) ×2 IMPLANT
GLOVE BIOGEL PI IND STRL 7.0 (GLOVE) IMPLANT
GLOVE BIOGEL PI IND STRL 7.5 (GLOVE) ×1 IMPLANT
GLOVE BIOGEL PI INDICATOR 7.0 (GLOVE) ×2
GLOVE BIOGEL PI INDICATOR 7.5 (GLOVE) ×1
GLOVE ECLIPSE 6.5 STRL STRAW (GLOVE) ×2 IMPLANT
GLOVE ECLIPSE 7.0 STRL STRAW (GLOVE) ×2 IMPLANT
GLOVE EXAM NITRILE MD LF STRL (GLOVE) ×1 IMPLANT
GOWN STRL REIN XL XLG (GOWN DISPOSABLE) ×6 IMPLANT
HEMOSTAT SNOW SURGICEL 2X4 (HEMOSTASIS) ×2 IMPLANT
INST SET LAPROSCOPIC AP (KITS) ×2 IMPLANT
IV NS IRRIG 3000ML ARTHROMATIC (IV SOLUTION) ×2 IMPLANT
KIT ROOM TURNOVER APOR (KITS) ×2 IMPLANT
KIT TROCAR LAP CHOLE (TROCAR) ×2 IMPLANT
MANIFOLD NEPTUNE II (INSTRUMENTS) ×2 IMPLANT
PACK LAP CHOLE LZT030E (CUSTOM PROCEDURE TRAY) ×2 IMPLANT
PAD ARMBOARD 7.5X6 YLW CONV (MISCELLANEOUS) ×2 IMPLANT
POUCH SPECIMEN RETRIEVAL 10MM (ENDOMECHANICALS) ×2 IMPLANT
SET BASIN LINEN APH (SET/KITS/TRAYS/PACK) ×2 IMPLANT
SET TUBE IRRIG SUCTION NO TIP (IRRIGATION / IRRIGATOR) ×2 IMPLANT
SLEEVE Z-THREAD 5X100MM (TROCAR) ×2 IMPLANT
SPONGE DRAIN TRACH 4X4 STRL 2S (GAUZE/BANDAGES/DRESSINGS) ×1 IMPLANT
SPONGE GAUZE 2X2 8PLY STRL LF (GAUZE/BANDAGES/DRESSINGS) ×3 IMPLANT
SPONGE GAUZE 4X4 12PLY (GAUZE/BANDAGES/DRESSINGS) ×1 IMPLANT
STAPLER VISISTAT (STAPLE) ×1 IMPLANT
STRIP CLOSURE SKIN 1/2X4 (GAUZE/BANDAGES/DRESSINGS) ×1 IMPLANT
SUT ETHILON 3 0 FSL (SUTURE) ×1 IMPLANT
SUT MNCRL AB 4-0 PS2 18 (SUTURE) ×3 IMPLANT
SUT VIC AB 2-0 CT2 27 (SUTURE) ×4 IMPLANT
WARMER LAPAROSCOPE (MISCELLANEOUS) ×2 IMPLANT

## 2011-02-14 NOTE — Transfer of Care (Signed)
Immediate Anesthesia Transfer of Care Note  Patient: Jerry Williams  Procedure(s) Performed:  LAPAROSCOPIC CHOLECYSTECTOMY  Patient Location: PACU  Anesthesia Type: General  Level of Consciousness: awake and alert   Airway & Oxygen Therapy: Patient Spontanous Breathing and Patient connected to face mask oxygen  Post-op Assessment: Report given to PACU RN  Post vital signs: Reviewed and stable  Complications: No apparent anesthesia complications

## 2011-02-14 NOTE — Progress Notes (Signed)
Day of Surgery  Subjective: Pain about the same.  No new changes.  No new questions.  Objective: Vital signs in last 24 hours: Temp:  [97.8 F (36.6 C)-98.5 F (36.9 C)] 98.3 F (36.8 C) (02/11 1107) Pulse Rate:  [60-98] 92  (02/11 1200) Resp:  [13-33] 30  (02/11 1215) BP: (117-162)/(67-93) 151/91 mmHg (02/11 1215) SpO2:  [93 %-98 %] 97 % (02/11 1215) Last BM Date: 02/12/11  Intake/Output from previous day: 02/10 0701 - 02/11 0700 In: 1290 [P.O.:480; I.V.:400; IV Piggyback:410] Out: 1650 [Urine:1650] Intake/Output this shift:    General appearance: alert and no distress Resp: clear to auscultation bilaterally Cardio: regular rate and rhythm GI: soft, obese, RUQ tenderness.  No peritoneal signs.  Lab Results:   Basename 02/14/11 0506 02/13/11 0920  WBC 6.8 10.4  HGB 13.1 13.2  HCT 40.6 40.7  PLT 137* 125*   BMET  Basename 02/14/11 0506 02/13/11 0920  NA 138 135  K 3.3* 3.7  CL 103 100  CO2 23 25  GLUCOSE 174* 236*  BUN 6 8  CREATININE 0.80 0.81  CALCIUM 8.9 8.9   PT/INR No results found for this basename: LABPROT:2,INR:2 in the last 72 hours ABG No results found for this basename: PHART:2,PCO2:2,PO2:2,HCO3:2 in the last 72 hours  Studies/Results: No results found.  Anti-infectives: Anti-infectives     Start     Dose/Rate Route Frequency Ordered Stop   02/11/11 2200   metroNIDAZOLE (FLAGYL) IVPB 500 mg        500 mg 100 mL/hr over 60 Minutes Intravenous Every 8 hours 02/11/11 2016     02/11/11 2100   ciprofloxacin (CIPRO) IVPB 400 mg        400 mg 200 mL/hr over 60 Minutes Intravenous Every 12 hours 02/11/11 2016            Assessment/Plan: s/p  LAPAROSCOPIC CHOLECYSTECTOMY plan to proceed to the OR as discussed.  Risks, benefits and alternatives again discussed.  Patien't's questions addressed.  LOS: 3 days    Avalin Briley C 02/14/2011

## 2011-02-14 NOTE — Anesthesia Preprocedure Evaluation (Addendum)
Anesthesia Evaluation  Patient identified by MRN, date of birth, ID band Patient awake    Reviewed: Allergy & Precautions, H&P , NPO status , Patient's Chart, lab work & pertinent test results  History of Anesthesia Complications (+) PONV  Airway Mallampati: I      Dental  (+) Edentulous Upper and Edentulous Lower   Pulmonary COPDformer smoker   Pulmonary exam normal       Cardiovascular hypertension, Pt. on medications + CAD and + CABG + dysrhythmias (PVC's) Regular Normal    Neuro/Psych    GI/Hepatic GERD-  Medicated and Controlled,  Endo/Other  Diabetes mellitus-, Well Controlled, Type 2, Oral Hypoglycemic Agents  Renal/GU      Musculoskeletal   Abdominal (+) obese,   Peds  Hematology   Anesthesia Other Findings   Reproductive/Obstetrics                          Anesthesia Physical Anesthesia Plan  ASA: III  Anesthesia Plan: General   Post-op Pain Management:    Induction: Intravenous, Rapid sequence and Cricoid pressure planned  Airway Management Planned: Oral ETT  Additional Equipment:   Intra-op Plan:   Post-operative Plan:   Informed Consent: I have reviewed the patients History and Physical, chart, labs and discussed the procedure including the risks, benefits and alternatives for the proposed anesthesia with the patient or authorized representative who has indicated his/her understanding and acceptance.     Plan Discussed with:   Anesthesia Plan Comments:         Anesthesia Quick Evaluation

## 2011-02-14 NOTE — Op Note (Signed)
Patient:  Jerry Williams  DOB:  1950/03/12  MRN:  [id number]   Preop Diagnosis:  Acute cholecystitis  Postop Diagnosis:  The same  Procedure:  Laparoscopic cholecystectomy  Surgeon:  Dr. Tilford Pillar  Anes:  General endotracheal, 0.5% Sensorcaine plain for local  Indications:  Patient is a 61 year old male with a history of right upper quadrant bowel pain he was evaluated in the emergency department [facility] he was determined to have acute cholecystitis. Workup and evaluation was consistent for acute cholecystitis. Risks benefits alternatives of a laparoscopic possible open cholecystectomy were discussed at length the patient. His questions and concerns were addressed the patient was consented for the planned procedure.  Procedure note:  Patient was taken to the or was placed in supine position on the OR table time the general anesthetic is a Optician, dispensing. Once patient was asleep he was endotracheally intubated by the nurse anesthetist. At this point his abdomen is prepped DuraPrep solution and draped in standard fashion. A stab incision was created supraumbilically with 11 blade scalpel. Additional dissection down to subcuticular tissues carried using a Coker clamp which he utilized grasp the anterior bowel wall fascia and lift this anteriorly. A Veress needle is inserted saline drop test is utilized confirm intraperitoneal placement. At this point pneumoperitoneum was initiated. Once sufficient pneumoperitoneum was obtained an 11 mm was inserted over laparoscopic allowing visualization the trocar entering into the peritoneal cavity. At this point the inner cannulas removed the laps was reinserted there is no evidence of any varus or trocar placement injury. At this time the remaining trochars replaced with a 5 mm current epigastrium a 5 mm can the midline and a 5 mm trocar in the right lateral abdominal wall. Patient's placed into a reverse Trendelenburg left lateral decubitus position. At this  point the right lobe the liver is identified. The omentum is intimately attached to the fundus of the gallbladder. There is obvious inflammatory phlegmon in this area. I did bluntly stripped the omentum off of the fundus of the gallbladder exposing the fundus of the gallbladder. Due to the tight nature of the gallbladder I did opt to drain this with a Weck needle. The returning aspirate was a dark brown black color. Upon decompressing the gallbladder was grasped a regular grasper and lifted up and over the right lobe liver. At combination of [place] and blunt dissection with dissection irrigator was utilized to expose the remainder of the gallbladder. The infundibulum was exposed and the cystic duct was dissected. A window was created behind the cystic duct. Due to the size of the cystic duct I opted to divide this with a 35 Endo GIA vascular stapler. At this point did change the 5 mm epigastric trocar to a 11 mm trocar. The stapler was advanced and was utilized to divide the cystic duct again confirming that these cystic duct was entering into the infundibulum of the gallbladder. At this point I was able to identify the cystic artery. Endoclips were placed across the cystic artery which was divided between the 2 most distal clips. Electrocautery was utilized to dissect the gallbladder free from the gallbladder fossa. Hemostasis was adequate during the dissection having been attainable electrocautery. Once the gallbladder was free it was placed into an Endo Catch bag and placed up and over the right lobe liver. I then copiously irrigated the surgical field. Hemostasis again was adequate within the gallbladder fossa. The divided cystic duct and cystic artery were inspected with no evidence of any bleeding or bile  leak. Due to the raw nature of the gallbladder fossa after aspirating the irrigation fluid I did opt to place a piece of Surgicel snow into the gallbladder fossa. At this point I did use a Endo Close suture  passing device to pass a 2-0 Vicryl suture through the epigastric trocar site.  At this time I then placed the 10 flat JP drain. This was advanced through the epigastric trocar site with the tail of the drain being pulled through the right lateral 5 mm trocar site. The drain was positioned into the gallbladder fossa and along the right paracolic gutter. The omentum was allowed to return back into the gallbladder fossa. At this time I turned my attention to closure.  The gallbladder was retrieved and was removed through the umbilical trocar site. In order to adequately enlarge the trocar site sharp dissection was required to enlarge both the skin and fascial defect. During this dissection the Endo Catch bag was cut with the 11 blade scalpel. The entire bag and gallbladder were removed. Both were inspected before passing off of the back table. The gallbladder was sent as a permanent specimen to pathology. At this point the pneumoperitoneum was evacuated. The trochars were removed. The JP drain was secured to the skin with a 3-0 nylon. The fascia at the umbilical trocar site was reapproximated using a 2-0 Vicryl in a running continuous fashion. The skin edges at 3 remaining trochars were closed using skin staples. The skin was washed dried moist dry towel. Dressings are placed over the 3 trocar sites as well as a drain sponge around the drain. The drapes removed the dressings were secured and the patient was allowed to come out of general anesthetic. He is transferred to the postanesthetic care unit in stable condition. At the conclusion of procedure all instrument, sponge, needle counts are correct. Patient tolerated procedure extremely well.  Complications:  None apparent  EBL:  250 ML's  Specimen:  Gallbladder

## 2011-02-15 LAB — CBC
HCT: 39.6 % (ref 39.0–52.0)
Hemoglobin: 13 g/dL (ref 13.0–17.0)
MCH: 26.6 pg (ref 26.0–34.0)
MCHC: 32.8 g/dL (ref 30.0–36.0)
MCV: 81.1 fL (ref 78.0–100.0)
Platelets: 170 10*3/uL (ref 150–400)
RBC: 4.88 MIL/uL (ref 4.22–5.81)
RDW: 15.3 % (ref 11.5–15.5)
WBC: 10.9 10*3/uL — ABNORMAL HIGH (ref 4.0–10.5)

## 2011-02-15 LAB — GLUCOSE, CAPILLARY
Glucose-Capillary: 260 mg/dL — ABNORMAL HIGH (ref 70–99)
Glucose-Capillary: 212 mg/dL — ABNORMAL HIGH (ref 70–99)
Glucose-Capillary: 205 mg/dL — ABNORMAL HIGH (ref 70–99)
Glucose-Capillary: 268 mg/dL — ABNORMAL HIGH (ref 70–99)

## 2011-02-15 LAB — BASIC METABOLIC PANEL
CO2: 19 mEq/L (ref 19–32)
Chloride: 101 mEq/L (ref 96–112)
GFR calc Af Amer: >90 mL/min (ref >90)
Potassium: 3.6 mEq/L (ref 3.5–5.1)
Sodium: 134 mEq/L — ABNORMAL LOW (ref 135–145)

## 2011-02-15 MED ORDER — SODIUM CHLORIDE 0.9 % IJ SOLN
INTRAMUSCULAR | Status: AC
  Filled 2011-02-15: qty 3

## 2011-02-15 MED ORDER — HYDROCODONE-ACETAMINOPHEN 5-325 MG PO TABS: 1.0000 | ORAL_TABLET | ORAL | Status: AC | PRN

## 2011-02-15 NOTE — Plan of Care (Signed)
Problem: Phase I Progression Outcomes Goal: Initial discharge plan identified Outcome: Completed/Met Date Met:  02/15/11 home

## 2011-02-15 NOTE — Discharge Summary (Signed)
Physician Discharge Summary  Patient ID: Jerry Williams MRN: [id number] DOB/AGE: 1950-02-24 61 y.o.  Admit date: 02/11/2011 Discharge date: 02/15/2011  Admission Diagnoses: Acute cholecystitis  Discharge Diagnoses: The same Active Problems:  * No active hospital problems. *    Discharged Condition: stable  Hospital Course: Patient was admitted with right upper quadrant and epigastric abdominal pain. Workup and evaluation was consistent for acute cholecystitis. Patient was taken to the operating room for a laparoscopic cholecystectomy. Postoperatively he did extremely well and was monitored on telemetry. He was advanced on his diet. His pain was controlled. Drainage from his JP drain was minimal. Morning labs looked good. Plans for discharge were discussed with patient. He was comfNonea of discharge.  Consults: None  Significant Diagnostic Studies: radiology: CT scan: Abdomen and pelvis   Treatments: surgery: Laparoscopic cholecystectomy   Discharge Exam: Blood pressure 157/82, pulse 84, temperature 97.6 F (36.4 C), temperature source Oral, resp. rate 20, height 5\' 11"  (1.803 m), weight 103.103 kg (227 lb 4.8 oz), SpO2 95.00%. General appearance: alert and no distress Resp: clear to auscultation bilaterally Cardio: regular rate and rhythm GI: Soft, anticipated right upper quadrant abdominal tenderness. JP drain demonstrates serous sanguinous discharge. Incisions are clean dry and intact. No diffuse peritoneal signs.  Disposition:   Discharge Orders    Future Orders Please Complete By Expires   Diet - low sodium heart healthy      Increase activity slowly      Discharge instructions      Comments:   Increase activity as tolerated. May place ice pack for comfort.    Driving Restrictions      Comments:   No driving while on pain medications.    Lifting restrictions      Comments:   No lifting over 20lbs for 4-5 weeks post-op.    Discharge wound care:      Comments:   Clean surgical sites with soap and water.  May shower the morning after surgery unless instructed by Dr. Leticia Penna otherwise.  No soaking for 2-3 weeks.    If adhesive strips are in place, they may be removed in 1-2 weeks while in the shower.    Call MD for:  temperature >100.4      Call MD for:  persistant nausea and vomiting      Call MD for:  redness, tenderness, or signs of infection (pain, swelling, redness, odor or green/yellow discharge around incision site)      Call MD for:  difficulty breathing, headache or visual disturbances      Call MD for:  severe uncontrolled pain        Medication List  As of 02/15/2011  1:21 PM   TAKE these medications         aspirin 325 MG EC tablet   Take 325 mg by mouth every morning.      benazepril 5 MG tablet   Commonly known as: LOTENSIN   Take 5 mg by mouth daily.      dicyclomine 20 MG tablet   Commonly known as: BENTYL   Take 20 mg by mouth 4 (four) times daily as needed. For stomach      esomeprazole 40 MG capsule   Commonly known as: NEXIUM   Take 40 mg by mouth daily with supper. At 6pm      HYDROcodone-acetaminophen 5-325 MG per tablet   Commonly known as: NORCO   Take 1-2 tablets by mouth every 4 (four) hours as needed.  lovastatin 20 MG tablet   Commonly known as: MEVACOR   Take 20 mg by mouth at bedtime.      Magnesium 250 MG Tabs   Take 1 tablet by mouth daily.      metoprolol 50 MG tablet   Commonly known as: LOPRESSOR   Take 50 mg by mouth 2 (two) times daily. **Take 150 mg by mouth twice daily. Take one 50 mg  Tablet with one 100 mg tablet to total 150 mg twice daily**      metoprolol 100 MG tablet   Commonly known as: LOPRESSOR   Take 100 mg by mouth 2 (two) times daily. **Take 150 mg by mouth twice daily. Take one 50 mg  Tablet with one 100 mg tablet to total 150 mg twice daily**      OVER THE COUNTER MEDICATION   Take 1 tablet by mouth 3 (three) times daily with meals. "Dietary Supplement-Key 1-Raspberry  flavored"      polycarbophil 625 MG tablet   Commonly known as: FIBERCON   Take 1,250 mg by mouth daily with supper. At 6pm      PRANDIN PO   Take 1 tablet by mouth every morning.      sitaGLIPtan-metformin 50-1000 MG per tablet   Commonly known as: JANUMET   Take 1 tablet by mouth 2 (two) times daily with a meal.           Follow-up Information    Follow up with Rosaleen Mazer C, MD in 1 week.   Contact information:   [address] [place] [address] [phone]          Signed: Fabio Bering 02/15/2011, 1:21 PM

## 2011-02-15 NOTE — Anesthesia Postprocedure Evaluation (Signed)
  Anesthesia Post-op Note  Patient: Jerry Williams  Procedure(s) Performed: Procedure(s) (LRB): LAPAROSCOPIC CHOLECYSTECTOMY (N/A)  Patient Location: Room 326  Anesthesia Type: General  Level of Consciousness: awake, alert , oriented and patient cooperative  Airway and Oxygen Therapy: Patient Spontanous Breathing  Post-op Pain: mild  Post-op Assessment: Post-op Vital signs reviewed, Patient's Cardiovascular Status Stable, Respiratory Function Stable, Patent Airway, No signs of Nausea or vomiting, Adequate PO intake and Pain level controlled  Post-op Vital Signs: Reviewed and stable  Complications: No apparent anesthesia complications

## 2011-02-15 NOTE — Progress Notes (Signed)
PIV removed without complaint, patient discharged home. Patient verbalizes understanding of discharge instructions, follow up care and prescriptions. Patient escorted out by staff, transported by family. 

## 2011-02-15 NOTE — Progress Notes (Signed)
Inpatient Diabetes Program Recommendations  AACE/ADA: New Consensus Statement on Inpatient Glycemic Control (2009)  Target Ranges:  Prepandial:   less than 140 mg/dL      Peak postprandial:   less than 180 mg/dL (1-2 hours)      Critically ill patients:  140 - 180 mg/dL   Reason for Visit: Elevated glucose in 200s  Inpatient Diabetes Program Recommendations Correction (SSI): Increase to Resistant Novolog Correction

## 2011-02-16 ENCOUNTER — Encounter (HOSPITAL_COMMUNITY): Payer: Self-pay | Admitting: General Surgery

## 2011-02-22 NOTE — Progress Notes (Signed)
UR Chart Review Completed  

## 2011-10-11 ENCOUNTER — Encounter: Payer: Self-pay | Admitting: Internal Medicine

## 2011-12-11 ENCOUNTER — Observation Stay (HOSPITAL_COMMUNITY)
Admission: EM | Admit: 2011-12-11 | Discharge: 2011-12-11 | Disposition: A | Discharge status: Home / Self Care | Payer: BC Managed Care – PPO | Attending: Internal Medicine | Admitting: Internal Medicine

## 2011-12-11 ENCOUNTER — Emergency Department (HOSPITAL_COMMUNITY): Payer: BC Managed Care – PPO

## 2011-12-11 ENCOUNTER — Encounter (HOSPITAL_COMMUNITY): Payer: Self-pay | Admitting: Emergency Medicine

## 2011-12-11 DIAGNOSIS — E1149 Type 2 diabetes mellitus with other diabetic neurological complication: Secondary | ICD-10-CM

## 2011-12-11 DIAGNOSIS — R142 Eructation: Secondary | ICD-10-CM

## 2011-12-11 DIAGNOSIS — N189 Chronic kidney disease, unspecified: Secondary | ICD-10-CM | POA: Insufficient documentation

## 2011-12-11 DIAGNOSIS — R109 Unspecified abdominal pain: Secondary | ICD-10-CM

## 2011-12-11 DIAGNOSIS — J4489 Other specified chronic obstructive pulmonary disease: Secondary | ICD-10-CM | POA: Insufficient documentation

## 2011-12-11 DIAGNOSIS — I493 Ventricular premature depolarization: Secondary | ICD-10-CM | POA: Diagnosis present

## 2011-12-11 DIAGNOSIS — Z86010 Personal history of colonic polyps: Secondary | ICD-10-CM

## 2011-12-11 DIAGNOSIS — D126 Benign neoplasm of colon, unspecified: Secondary | ICD-10-CM

## 2011-12-11 DIAGNOSIS — I2510 Atherosclerotic heart disease of native coronary artery without angina pectoris: Secondary | ICD-10-CM | POA: Diagnosis present

## 2011-12-11 DIAGNOSIS — Z951 Presence of aortocoronary bypass graft: Secondary | ICD-10-CM | POA: Insufficient documentation

## 2011-12-11 DIAGNOSIS — R002 Palpitations: Principal | ICD-10-CM | POA: Diagnosis present

## 2011-12-11 DIAGNOSIS — R079 Chest pain, unspecified: Secondary | ICD-10-CM | POA: Diagnosis present

## 2011-12-11 DIAGNOSIS — E119 Type 2 diabetes mellitus without complications: Secondary | ICD-10-CM | POA: Diagnosis present

## 2011-12-11 DIAGNOSIS — I10 Essential (primary) hypertension: Secondary | ICD-10-CM | POA: Diagnosis present

## 2011-12-11 DIAGNOSIS — J449 Chronic obstructive pulmonary disease, unspecified: Secondary | ICD-10-CM | POA: Insufficient documentation

## 2011-12-11 DIAGNOSIS — I129 Hypertensive chronic kidney disease with stage 1 through stage 4 chronic kidney disease, or unspecified chronic kidney disease: Secondary | ICD-10-CM | POA: Insufficient documentation

## 2011-12-11 LAB — COMPREHENSIVE METABOLIC PANEL
ALT: 24 U/L (ref 0–53)
AST: 29 U/L (ref 0–37)
Albumin: 4 g/dL (ref 3.5–5.2)
Alkaline Phosphatase: 80 U/L (ref 39–117)
BUN: 10 mg/dL (ref 6–23)
CO2: 21 mEq/L (ref 19–32)
Calcium: 9.6 mg/dL (ref 8.4–10.5)
Chloride: 100 mEq/L (ref 96–112)
Chloride: 102 mEq/L (ref 96–112)
Creatinine, Ser: 0.82 mg/dL (ref 0.50–1.35)
GFR calc Af Amer: >90 mL/min (ref >90)
Glucose, Bld: 162 mg/dL — ABNORMAL HIGH (ref 70–99)
Potassium: 3.6 mEq/L (ref 3.5–5.1)
Sodium: 135 mEq/L (ref 135–145)
Sodium: 138 mEq/L (ref 135–145)
Total Bilirubin: 0.3 mg/dL (ref 0.3–1.2)
Total Bilirubin: 0.3 mg/dL (ref 0.3–1.2)

## 2011-12-11 LAB — CBC WITH DIFFERENTIAL/PLATELET
Hemoglobin: 13.8 g/dL (ref 13.0–17.0)
Lymphocytes Relative: 39 % (ref 12–46)
Lymphs Abs: 1.6 10*3/uL (ref 0.7–4.0)
MCH: 24.3 pg — ABNORMAL LOW (ref 26.0–34.0)
Monocytes Relative: 7 % (ref 3–12)
Neutro Abs: 2.2 10*3/uL (ref 1.7–7.7)
Neutrophils Relative %: 52 % (ref 43–77)
Platelets: 143 10*3/uL — ABNORMAL LOW (ref 150–400)
RBC: 5.69 MIL/uL (ref 4.22–5.81)
WBC: 4.2 10*3/uL (ref 4.0–10.5)

## 2011-12-11 LAB — GLUCOSE, CAPILLARY

## 2011-12-11 LAB — CBC
MCV: 73.7 fL — ABNORMAL LOW (ref 78.0–100.0)
Platelets: 168 10*3/uL (ref 150–400)
RDW: 16.6 % — ABNORMAL HIGH (ref 11.5–15.5)
WBC: 6.4 10*3/uL (ref 4.0–10.5)

## 2011-12-11 LAB — MRSA PCR SCREENING: MRSA by PCR: NEGATIVE

## 2011-12-11 LAB — URINALYSIS, ROUTINE W REFLEX MICROSCOPIC
Bilirubin Urine: NEGATIVE
Leukocytes, UA: NEGATIVE
Nitrite: NEGATIVE
Specific Gravity, Urine: 1.01 (ref 1.005–1.030)
Urobilinogen, UA: 0.2 mg/dL (ref 0.0–1.0)

## 2011-12-11 LAB — TROPONIN I
Troponin I: <0.3 ng/mL (ref <0.30)
Troponin I: <0.3 ng/mL (ref <0.30)

## 2011-12-11 MED ORDER — DICYCLOMINE HCL 20 MG PO TABS
20.0000 mg | ORAL_TABLET | Freq: Four times a day (QID) | ORAL | Status: DC | PRN
  Filled 2011-12-11: qty 1

## 2011-12-11 MED ORDER — ENOXAPARIN SODIUM 40 MG/0.4ML ~~LOC~~ SOLN
40.0000 mg | SUBCUTANEOUS | Status: DC
  Filled 2011-12-11: qty 0.4

## 2011-12-11 MED ORDER — DICYCLOMINE HCL 10 MG PO CAPS
20.0000 mg | ORAL_CAPSULE | Freq: Four times a day (QID) | ORAL | Status: DC | PRN
  Filled 2011-12-11: qty 2

## 2011-12-11 MED ORDER — POTASSIUM CHLORIDE CRYS ER 20 MEQ PO TBCR
40.0000 meq | EXTENDED_RELEASE_TABLET | Freq: Once | ORAL | Status: AC
  Administered 2011-12-11: 40 meq via ORAL
  Filled 2011-12-11: qty 2

## 2011-12-11 MED ORDER — METOPROLOL TARTRATE 50 MG PO TABS: 25.0000 mg | ORAL_TABLET | Freq: Two times a day (BID) | ORAL | Status: DC | PRN

## 2011-12-11 MED ORDER — SODIUM CHLORIDE 0.9 % IJ SOLN
3.0000 mL | Freq: Two times a day (BID) | INTRAMUSCULAR | Status: DC
  Administered 2011-12-11: 3 mL via INTRAVENOUS

## 2011-12-11 MED ORDER — ONDANSETRON HCL 4 MG PO TABS: 4.0000 mg | ORAL_TABLET | Freq: Four times a day (QID) | ORAL | Status: DC | PRN

## 2011-12-11 MED ORDER — ASPIRIN EC 325 MG PO TBEC: 325.0000 mg | DELAYED_RELEASE_TABLET | Freq: Every day | ORAL | Status: DC

## 2011-12-11 MED ORDER — ACETAMINOPHEN 325 MG PO TABS: 650.0000 mg | ORAL_TABLET | Freq: Four times a day (QID) | ORAL | Status: DC | PRN

## 2011-12-11 MED ORDER — BENAZEPRIL HCL 10 MG PO TABS
5.0000 mg | ORAL_TABLET | Freq: Every day | ORAL | Status: DC
  Administered 2011-12-11: 5 mg via ORAL
  Filled 2011-12-11: qty 1

## 2011-12-11 MED ORDER — INSULIN ASPART 100 UNIT/ML ~~LOC~~ SOLN
0.0000 [IU] | Freq: Three times a day (TID) | SUBCUTANEOUS | Status: DC
  Administered 2011-12-11: 2 [IU] via SUBCUTANEOUS

## 2011-12-11 MED ORDER — SODIUM CHLORIDE 0.9 % IV SOLN: INTRAVENOUS | Status: DC

## 2011-12-11 MED ORDER — SIMVASTATIN 20 MG PO TABS: 10.0000 mg | ORAL_TABLET | Freq: Every day | ORAL | Status: DC

## 2011-12-11 MED ORDER — METOPROLOL TARTRATE 50 MG PO TABS
150.0000 mg | ORAL_TABLET | Freq: Two times a day (BID) | ORAL | Status: DC
  Administered 2011-12-11: 150 mg via ORAL
  Filled 2011-12-11: qty 3

## 2011-12-11 MED ORDER — ONDANSETRON HCL 4 MG/2ML IJ SOLN: 4.0000 mg | Freq: Four times a day (QID) | INTRAMUSCULAR | Status: DC | PRN

## 2011-12-11 MED ORDER — PANTOPRAZOLE SODIUM 40 MG PO TBEC
40.0000 mg | DELAYED_RELEASE_TABLET | Freq: Every day | ORAL | Status: DC
  Administered 2011-12-11: 40 mg via ORAL
  Filled 2011-12-11: qty 1

## 2011-12-11 MED ORDER — ACETAMINOPHEN 650 MG RE SUPP: 650.0000 mg | Freq: Four times a day (QID) | RECTAL | Status: DC | PRN

## 2011-12-11 MED ORDER — NITROGLYCERIN 2 % TD OINT
1.0000 [in_us] | TOPICAL_OINTMENT | Freq: Once | TRANSDERMAL | Status: AC
  Administered 2011-12-11: 1 [in_us] via TOPICAL
  Filled 2011-12-11: qty 1

## 2011-12-11 NOTE — Discharge Summary (Signed)
Physician Discharge Summary  Patient ID: Jerry Williams MRN: [id number] DOB/AGE: 1950-06-18 61 y.o.  Admit date: 12/11/2011 Discharge date: 12/11/2011  Discharge Diagnoses:  Principal Problem:  *Chest pain Active Problems:  Frequent PVCs  DIABETES MELLITUS, WITH NEUROLOGICAL COMPLICATIONS  HYPERTENSION  Palpitations  CAD (coronary artery disease)    Medication List     As of 12/11/2011  9:15 AM    TAKE these medications         aspirin 325 MG EC tablet   Take 325 mg by mouth at bedtime.      benazepril 5 MG tablet   Commonly known as: LOTENSIN   Take 5 mg by mouth daily.      dicyclomine 20 MG tablet   Commonly known as: BENTYL   Take 20 mg by mouth 4 (four) times daily as needed. For stomach      esomeprazole 40 MG capsule   Commonly known as: NEXIUM   Take 40 mg by mouth daily with supper. At 6pm      lovastatin 20 MG tablet   Commonly known as: MEVACOR   Take 20 mg by mouth at bedtime.      Magnesium 250 MG Tabs   Take 1 tablet by mouth daily.      metoprolol 50 MG tablet   Commonly known as: LOPRESSOR   Take 50 mg by mouth 2 (two) times daily. **Take 150 mg by mouth twice daily. Take one 50 mg  Tablet with one 100 mg tablet to total 150 mg twice daily**      metoprolol 100 MG tablet   Commonly known as: LOPRESSOR   Take 100 mg by mouth 2 (two) times daily. **Take 150 mg by mouth twice daily. Take one 50 mg  Tablet with one 100 mg tablet to total 150 mg twice daily**      metoprolol 50 MG tablet   Commonly known as: LOPRESSOR   Take 0.5 tablets (25 mg total) by mouth 2 (two) times daily as needed (heart racing).      OVER THE COUNTER MEDICATION   Take 1 tablet by mouth 3 (three) times daily with meals. "Dietary Supplement-Key 1-Raspberry flavored"      polycarbophil 625 MG tablet   Commonly known as: FIBERCON   Take 1,250 mg by mouth daily with supper. At 6pm      PRANDIN PO   Take 1 tablet by mouth every morning.      sitaGLIPtan-metformin  50-1000 MG per tablet   Commonly known as: JANUMET   Take 1 tablet by mouth 2 (two) times daily with a meal.            Discharge Orders    Future Orders Please Complete By Expires   Diet - low sodium heart healthy      Diet Carb Modified      Activity as tolerated - No restrictions         Follow-up Information    Schedule an appointment as soon as possible for a visit with your cardiologist.         Disposition: 01-Home or Self Care  Discharged Condition: stable  Consults:  none  Labs:   Results for orders placed during the hospital encounter of 12/11/11 (from the past 48 hour(s))  CBC WITH DIFFERENTIAL     Status: Abnormal   Collection Time   12/11/11  1:08 AM      Component Value Range Comment   WBC 4.2  4.0 - 10.5  K/uL    RBC 5.69  4.22 - 5.81 MIL/uL    Hemoglobin 13.8  13.0 - 17.0 g/dL    HCT 16.1  09.6 - 04.5 %    MCV 75.4 (*) 78.0 - 100.0 fL    MCH 24.3 (*) 26.0 - 34.0 pg    MCHC 32.2  30.0 - 36.0 g/dL    RDW 40.9 (*) 81.1 - 15.5 %    Platelets 143 (*) 150 - 400 K/uL    Neutrophils Relative 52  43 - 77 %    Neutro Abs 2.2  1.7 - 7.7 K/uL    Lymphocytes Relative 39  12 - 46 %    Lymphs Abs 1.6  0.7 - 4.0 K/uL    Monocytes Relative 7  3 - 12 %    Monocytes Absolute 0.3  0.1 - 1.0 K/uL    Eosinophils Relative 2  0 - 5 %    Eosinophils Absolute 0.1  0.0 - 0.7 K/uL    Basophils Relative 1  0 - 1 %    Basophils Absolute 0.0  0.0 - 0.1 K/uL   COMPREHENSIVE METABOLIC PANEL     Status: Abnormal   Collection Time   12/11/11  1:08 AM      Component Value Range Comment   Sodium 135  135 - 145 mEq/L    Potassium 3.6  3.5 - 5.1 mEq/L    Chloride 100  96 - 112 mEq/L    CO2 21  19 - 32 mEq/L    Glucose, Bld 162 (*) 70 - 99 mg/dL    BUN 10  6 - 23 mg/dL    Creatinine, Ser 9.14  0.50 - 1.35 mg/dL    Calcium 9.8  8.4 - 78.2 mg/dL    Total Protein 7.6  6.0 - 8.3 g/dL    Albumin 4.0  3.5 - 5.2 g/dL    AST 32  0 - 37 U/L    ALT 24  0 - 53 U/L    Alkaline  Phosphatase 80  39 - 117 U/L    Total Bilirubin 0.3  0.3 - 1.2 mg/dL    GFR calc non Af Amer >90  >90 mL/min    GFR calc Af Amer >90  >90 mL/min   TROPONIN I     Status: Normal   Collection Time   12/11/11  1:08 AM      Component Value Range Comment   Troponin I <0.30  <0.30 ng/mL   MAGNESIUM     Status: Normal   Collection Time   12/11/11  1:08 AM      Component Value Range Comment   Magnesium 1.7  1.5 - 2.5 mg/dL   MRSA PCR SCREENING     Status: Normal   Collection Time   12/11/11  4:21 AM      Component Value Range Comment   MRSA by PCR NEGATIVE  NEGATIVE   URINALYSIS, ROUTINE W REFLEX MICROSCOPIC     Status: Normal   Collection Time   12/11/11  4:31 AM      Component Value Range Comment   Color, Urine YELLOW  YELLOW    APPearance CLEAR  CLEAR    Specific Gravity, Urine 1.010  1.005 - 1.030    pH 6.0  5.0 - 8.0    Glucose, UA NEGATIVE  NEGATIVE mg/dL    Hgb urine dipstick NEGATIVE  NEGATIVE    Bilirubin Urine NEGATIVE  NEGATIVE    Ketones, ur NEGATIVE  NEGATIVE mg/dL    Protein, ur NEGATIVE  NEGATIVE mg/dL    Urobilinogen, UA 0.2  0.0 - 1.0 mg/dL    Nitrite NEGATIVE  NEGATIVE    Leukocytes, UA NEGATIVE  NEGATIVE MICROSCOPIC NOT DONE ON URINES WITH NEGATIVE PROTEIN, BLOOD, LEUKOCYTES, NITRITE, OR GLUCOSE <1000 mg/dL.  TROPONIN I     Status: Normal   Collection Time   12/11/11  4:36 AM      Component Value Range Comment   Troponin I <0.30  <0.30 ng/mL   CBC     Status: Abnormal   Collection Time   12/11/11  4:36 AM      Component Value Range Comment   WBC 6.4  4.0 - 10.5 K/uL    RBC 5.59  4.22 - 5.81 MIL/uL    Hemoglobin 13.2  13.0 - 17.0 g/dL    HCT 78.2  95.6 - 21.3 %    MCV 73.7 (*) 78.0 - 100.0 fL    MCH 23.6 (*) 26.0 - 34.0 pg    MCHC 32.0  30.0 - 36.0 g/dL    RDW 08.6 (*) 57.8 - 15.5 %    Platelets 168  150 - 400 K/uL   COMPREHENSIVE METABOLIC PANEL     Status: Abnormal   Collection Time   12/11/11  4:36 AM      Component Value Range Comment   Sodium 138  135  - 145 mEq/L    Potassium 4.2  3.5 - 5.1 mEq/L    Chloride 102  96 - 112 mEq/L    CO2 23  19 - 32 mEq/L    Glucose, Bld 137 (*) 70 - 99 mg/dL    BUN 10  6 - 23 mg/dL    Creatinine, Ser 4.69  0.50 - 1.35 mg/dL    Calcium 9.6  8.4 - 62.9 mg/dL    Total Protein 7.3  6.0 - 8.3 g/dL    Albumin 4.0  3.5 - 5.2 g/dL    AST 29  0 - 37 U/L    ALT 23  0 - 53 U/L    Alkaline Phosphatase 76  39 - 117 U/L    Total Bilirubin 0.3  0.3 - 1.2 mg/dL    GFR calc non Af Amer >90  >90 mL/min    GFR calc Af Amer >90  >90 mL/min   GLUCOSE, CAPILLARY     Status: Abnormal   Collection Time   12/11/11  7:24 AM      Component Value Range Comment   Glucose-Capillary 123 (*) 70 - 99 mg/dL    Comment 1 Documented in Chart      Comment 2 Notify RN       Diagnostics:  Dg Chest Portable 1 View  12/11/2011  *RADIOLOGY REPORT*  Clinical Data: Shortness of breath.  PORTABLE CHEST - 1 VIEW  Comparison: 09/08/2009.  Findings: Enlarged cardiac silhouette with a mild increase in size. Interval median sternotomy wires and single surgical clip overlying the right lower lung zone.  Clear lungs with normal vascularity. Thoracic spine degenerative changes.  IMPRESSION: Mildly progressive cardiomegaly.  No acute abnormality.   Original Report Authenticated By: Beckie Salts, M.D.    EKG: Sinus rhythm with 1st degree A-V block Possible Left atrial enlargement Left axis deviation Left ventricular hypertrophy with repolarization abnormality Inferior infarct (cited on or before 08-Sep-2009) Anterior infarct (cited on or before 08-Sep-2009  Full Code   Hospital Course: See H&P for complete admission details. The patient is a 61 year old  white male with heart disease. He reported palpitations and chest tightness. Initial blood pressure in the emergency room was 174/107. Vital signs otherwise unremarkable. Physical exam unremarkable. EKG showed no new changes. Normal sinus rhythm. Patient was observed overnight on telemetry. He has had  PVCs only. He has ruled out for MI. He may followup with his cardiologist. He may take an extra 25 mg of metoprolol every 12 hours as needed for severe palpitations.  Discharge Exam:  Blood pressure 142/71, pulse 68, temperature 97.9 F (36.6 C), temperature source Oral, resp. rate 16, height 5\' 10"  (1.778 m), weight 107.96 kg (238 lb 0.1 oz), SpO2 97.00%.  Telemetry: Normal sinus rhythm with PVCs  Lungs clear to auscultation bilaterally without wheeze rhonchi or rales Cardiovascular regular rate rhythm without murmurs gallops rubs Abdomen soft nontender nondistended Extremities no clubbing cyanosis or edema  Signed: Calleen Alvis L 12/11/2011, 9:15 AM

## 2011-12-11 NOTE — Progress Notes (Signed)
Patient with orders to be discharged home. Discharge instructions given, patient verbalized understanding via teach back method. Patient in stable condition upon discharge. Patient left with friend in private vehicle.

## 2011-12-11 NOTE — ED Notes (Signed)
Patient c/o chest pressure and shortness of breath since 2200; patient states was nauseated about an hour ago, but that has gone away.

## 2011-12-11 NOTE — ED Provider Notes (Signed)
History     CSN: [id number]  Arrival date & time 12/11/11  2130   First MD Initiated Contact with Patient 12/11/11 0045      Chief Complaint  Patient presents with  . Shortness of Breath  . Chest Pain    (Consider location/radiation/quality/duration/timing/severity/associated sxs/prior treatment) Patient is a 61 y.o. male presenting with palpitations. The history is provided by the patient (pt states he had some dizziness and palpitaions today).  Palpitations  This is a recurrent problem. The current episode started 1 to 2 hours ago. The problem occurs constantly. The problem has not changed since onset.Associated with: nothing. Pertinent negatives include no chest pain, no abdominal pain, no headaches, no back pain and no cough.    Past Medical History  Diagnosis Date  . Hypertension   . Diabetes mellitus   . Colon polyp   . Obese   . PVC's (premature ventricular contractions)   . IBS (irritable bowel syndrome)   . Hyperlipidemia   . PONV (postoperative nausea and vomiting)   . Chronic kidney disease   . Coronary artery disease   . COPD (chronic obstructive pulmonary disease)     Past Surgical History  Procedure Date  . Cardiac catheterization   . Cystectomy     Scrotal abscess  . Coronary artery bypass graft   . Cholecystectomy 02/14/2011    Procedure: LAPAROSCOPIC CHOLECYSTECTOMY;  Surgeon: Fabio Bering, MD;  Location: [facility];  Service: General;  Laterality: N/A;    Family History  Problem Relation Age of Onset  . Hypertension Mother   . Emphysema Father   . Heart disease Sister     Enlarged heart    History  Substance Use Topics  . Smoking status: Former Games developer  . Smokeless tobacco: Not on file     Comment: Smoked 20 years on and off, quit >17 years ago  . Alcohol Use: No     Comment: Regular/ heavy use for 20 years, quit >17 years ago      Review of Systems  Constitutional: Negative for fatigue.  HENT: Negative for congestion, sinus pressure  and ear discharge.   Eyes: Negative for discharge.  Respiratory: Negative for cough.   Cardiovascular: Positive for palpitations. Negative for chest pain.  Gastrointestinal: Negative for abdominal pain and diarrhea.  Genitourinary: Negative for frequency and hematuria.  Musculoskeletal: Negative for back pain.  Skin: Negative for rash.  Neurological: Negative for seizures and headaches.  Hematological: Negative.   Psychiatric/Behavioral: Negative for hallucinations.    Allergies  Review of patient's allergies indicates no known allergies.  Home Medications   Current Outpatient Rx  Name  Route  Sig  Dispense  Refill  . ASPIRIN 325 MG PO TBEC   Oral   Take 325 mg by mouth at bedtime.          Marland Kitchen BENAZEPRIL HCL 5 MG PO TABS   Oral   Take 5 mg by mouth daily.         Marland Kitchen DICYCLOMINE HCL 20 MG PO TABS   Oral   Take 20 mg by mouth 4 (four) times daily as needed. For stomach         . ESOMEPRAZOLE MAGNESIUM 40 MG PO CPDR   Oral   Take 40 mg by mouth daily with supper. At 6pm         . LOVASTATIN 20 MG PO TABS   Oral   Take 20 mg by mouth at bedtime.         Marland Kitchen  MAGNESIUM 250 MG PO TABS   Oral   Take 1 tablet by mouth daily.           Marland Kitchen METOPROLOL TARTRATE 100 MG PO TABS   Oral   Take 100 mg by mouth 2 (two) times daily. **Take 150 mg by mouth twice daily. Take one 50 mg  Tablet with one 100 mg tablet to total 150 mg twice daily**         . METOPROLOL TARTRATE 50 MG PO TABS   Oral   Take 50 mg by mouth 2 (two) times daily. **Take 150 mg by mouth twice daily. Take one 50 mg  Tablet with one 100 mg tablet to total 150 mg twice daily**         . CALCIUM POLYCARBOPHIL 625 MG PO TABS   Oral   Take 1,250 mg by mouth daily with supper. At 6pm         . PRANDIN PO   Oral   Take 1 tablet by mouth every morning.         Marland Kitchen SITAGLIPTIN-METFORMIN HCL 50-1000 MG PO TABS   Oral   Take 1 tablet by mouth 2 (two) times daily with a meal.           . OVER THE  COUNTER MEDICATION   Oral   Take 1 tablet by mouth 3 (three) times daily with meals. "Dietary Supplement-Key 1-Raspberry flavored"           BP 137/77  Pulse 83  Temp 97.8 F (36.6 C) (Oral)  Resp 16  Ht 5\' 10"  (1.778 m)  Wt 238 lb (107.956 kg)  BMI 34.15 kg/m2  SpO2 96%  Physical Exam  Constitutional: He is oriented to person, place, and time. He appears well-developed.  HENT:  Head: Normocephalic and atraumatic.  Eyes: Conjunctivae normal and EOM are normal. No scleral icterus.  Neck: Neck supple. No thyromegaly present.  Cardiovascular: Normal rate and regular rhythm.  Exam reveals no gallop and no friction rub.   No murmur heard. Pulmonary/Chest: No stridor. He has no wheezes. He has no rales. He exhibits no tenderness.  Abdominal: He exhibits no distension. There is no tenderness. There is no rebound.  Musculoskeletal: Normal range of motion. He exhibits no edema.  Lymphadenopathy:    He has no cervical adenopathy.  Neurological: He is oriented to person, place, and time. Coordination normal.  Skin: No rash noted. No erythema.  Psychiatric: He has a normal mood and affect. His behavior is normal.    ED Course  Procedures (including critical care time)  Labs Reviewed  CBC WITH DIFFERENTIAL - Abnormal; Notable for the following:    MCV 75.4 (*)     MCH 24.3 (*)     RDW 16.0 (*)     Platelets 143 (*)     All other components within normal limits  COMPREHENSIVE METABOLIC PANEL - Abnormal; Notable for the following:    Glucose, Bld 162 (*)     All other components within normal limits  TROPONIN I   Dg Chest Portable 1 View  12/11/2011  *RADIOLOGY REPORT*  Clinical Data: Shortness of breath.  PORTABLE CHEST - 1 VIEW  Comparison: 09/08/2009.  Findings: Enlarged cardiac silhouette with a mild increase in size. Interval median sternotomy wires and single surgical clip overlying the right lower lung zone.  Clear lungs with normal vascularity. Thoracic spine degenerative  changes.  IMPRESSION: Mildly progressive cardiomegaly.  No acute abnormality.   Original Report Authenticated  By: Beckie Salts, M.D.      1. Palpitations      Date: 12/11/2011  Rate: 84  Rhythm: normal sinus rhythm  QRS Axis: left  Intervals: normal  ST/T Wave abnormalities:nonspecific st changes  Conduction Disutrbances:none  Narrative Interpretation:   Old EKG Reviewed: none available    MDM          Benny Lennert, MD 12/11/11 [phone]

## 2011-12-11 NOTE — H&P (Signed)
[facility] History and Physical  Jerry Williams WJX:[id number] DOB: 10/08/1950 DOA: 12/11/2011   PCP: He goes to the health department at [facility] Specialists: He is followed by cardiologist, in [facility]  Chief Complaint: Palpitations  HPI: Jerry Williams is a 61 y.o. male with a past medical history of coronary artery disease, status post CABG in 2012. He also has a history of diabetes, and hypertension. He was in his usual state of health till about 10 PM last night when he was watching television and started getting palpitations. 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 However, they don't last long. However the symptoms that started last night did not resolve for a long period of time. He started feeling dizzy and lightheaded. Had some chest tightness, and shortness of breath, and so, he decided to come in to the hospital. He did not have any syncopal episodes. By the time he came in to the ED, was already feeling better. Nitro paste was utilized. He is now symptom free. He admits to the ankle swelling once in a while. Had some nausea earlier today, but no vomiting. Denies any fever or chills. No recent illness. Denies any changes in his medications. He has a decaf coffee every morning.  Home Medications: Prior to Admission medications   Medication Sig Start Date End Date Taking? Authorizing Provider  aspirin 325 MG EC tablet Take 325 mg by mouth at bedtime.    Yes Historical Provider, MD  benazepril (LOTENSIN) 5 MG tablet Take 5 mg by mouth daily.   Yes Historical Provider, MD  dicyclomine (BENTYL) 20 MG tablet Take 20 mg by mouth 4 (four) times daily as needed. For stomach   Yes Historical Provider, MD  esomeprazole (NEXIUM) 40 MG capsule Take 40 mg by mouth daily with supper. At 6pm   Yes Historical Provider, MD  lovastatin (MEVACOR) 20 MG tablet Take 20 mg by mouth at bedtime.   Yes Historical Provider, MD  Magnesium 250 MG TABS Take 1 tablet by  mouth daily.     Yes Historical Provider, MD  metoprolol (LOPRESSOR) 100 MG tablet Take 100 mg by mouth 2 (two) times daily. **Take 150 mg by mouth twice daily. Take one 50 mg  Tablet with one 100 mg tablet to total 150 mg twice daily**   Yes Historical Provider, MD  metoprolol (LOPRESSOR) 50 MG tablet Take 50 mg by mouth 2 (two) times daily. **Take 150 mg by mouth twice daily. Take one 50 mg  Tablet with one 100 mg tablet to total 150 mg twice daily**   Yes Historical Provider, MD  polycarbophil (FIBERCON) 625 MG tablet Take 1,250 mg by mouth daily with supper. At 6pm   Yes Historical Provider, MD  Repaglinide (PRANDIN PO) Take 1 tablet by mouth every morning.   Yes Historical Provider, MD  sitaGLIPtan-metformin (JANUMET) 50-1000 MG per tablet Take 1 tablet by mouth 2 (two) times daily with a meal.     Yes Historical Provider, MD  OVER THE COUNTER MEDICATION Take 1 tablet by mouth 3 (three) times daily with meals. "Dietary Supplement-Key 1-Raspberry flavored"    Historical Provider, MD    Allergies: No Known Allergies  Past Medical History: Past Medical History  Diagnosis Date  . Hypertension   . Diabetes mellitus   . Colon polyp   . Obese   . PVC's (premature ventricular contractions)   . IBS (irritable bowel syndrome)   . Hyperlipidemia   .  PONV (postoperative nausea and vomiting)   . Chronic kidney disease   . Coronary artery disease   . COPD (chronic obstructive pulmonary disease)     Past Surgical History  Procedure Date  . Cardiac catheterization   . Cystectomy     Scrotal abscess  . Coronary artery bypass graft   . Cholecystectomy 02/14/2011    Procedure: LAPAROSCOPIC CHOLECYSTECTOMY;  Surgeon: Fabio Bering, MD;  Location: [facility];  Service: General;  Laterality: N/A;    Social History:  reports that he has quit smoking. He does not have any smokeless tobacco history on file. He reports that he does not drink alcohol or use illicit drugs.  Living Situation: Lives with  his wife Activity Level: Independent with his daily activities   Family History:  Family History  Problem Relation Age of Onset  . Hypertension Mother   . Emphysema Father   . Heart disease Sister     Enlarged heart    Review of Systems - History obtained from the patient General ROS: positive for  - fatigue Psychological ROS: negative Ophthalmic ROS: negative ENT ROS: negative Allergy and Immunology ROS: negative Hematological and Lymphatic ROS: negative Endocrine ROS: negative Respiratory ROS: as in hpi. Has a chronic cough Cardiovascular ROS: as in hpi Gastrointestinal ROS: no abdominal pain, change in bowel habits, or black or bloody stools Genito-Urinary ROS: admits to frequent urination Musculoskeletal ROS: negative Neurological ROS: no TIA or stroke symptoms Dermatological ROS: negative  Physical Examination  Filed Vitals:   12/11/11 0042 12/11/11 0100 12/11/11 0130  BP: 174/107 154/79 137/77  Pulse: 86 83 83  Temp: 97.8 F (36.6 C)    TempSrc: Oral    Resp: 22 19 16   Height: 5\' 10"  (1.778 m)    Weight: 107.956 kg (238 lb)    SpO2: 95% 96% 96%    General appearance: alert, cooperative, appears stated age and no distress Head: Normocephalic, without obvious abnormality, atraumatic Eyes: conjunctivae/corneas clear. PERRL, EOM's intact.  Throat: lips, mucosa, and tongue normal; teeth and gums normal Neck: no adenopathy, no carotid bruit, no JVD, supple, symmetrical, trachea midline and thyroid not enlarged, symmetric, no tenderness/mass/nodules Resp: clear to auscultation bilaterally Cardio: regular rate and rhythm, S1, S2 normal, no murmur, click, rub or gallop GI: soft, non-tender; bowel sounds normal; no masses,  no organomegaly Extremities: extremities normal, atraumatic, no cyanosis or edema Pulses: 2+ and symmetric Skin: Skin color, texture, turgor normal. No rashes or lesions Lymph nodes: Cervical, supraclavicular, and axillary nodes  normal. Neurologic: He is alert and oriented x3. No focal neurological deficits are present  Laboratory Data: Results for orders placed during the hospital encounter of 12/11/11 (from the past 48 hour(s))  CBC WITH DIFFERENTIAL     Status: Abnormal   Collection Time   12/11/11  1:08 AM      Component Value Range Comment   WBC 4.2  4.0 - 10.5 K/uL    RBC 5.69  4.22 - 5.81 MIL/uL    Hemoglobin 13.8  13.0 - 17.0 g/dL    HCT 03.4  74.2 - 59.5 %    MCV 75.4 (*) 78.0 - 100.0 fL    MCH 24.3 (*) 26.0 - 34.0 pg    MCHC 32.2  30.0 - 36.0 g/dL    RDW 63.8 (*) 75.6 - 15.5 %    Platelets 143 (*) 150 - 400 K/uL    Neutrophils Relative 52  43 - 77 %    Neutro Abs 2.2  1.7 -  7.7 K/uL    Lymphocytes Relative 39  12 - 46 %    Lymphs Abs 1.6  0.7 - 4.0 K/uL    Monocytes Relative 7  3 - 12 %    Monocytes Absolute 0.3  0.1 - 1.0 K/uL    Eosinophils Relative 2  0 - 5 %    Eosinophils Absolute 0.1  0.0 - 0.7 K/uL    Basophils Relative 1  0 - 1 %    Basophils Absolute 0.0  0.0 - 0.1 K/uL   COMPREHENSIVE METABOLIC PANEL     Status: Abnormal   Collection Time   12/11/11  1:08 AM      Component Value Range Comment   Sodium 135  135 - 145 mEq/L    Potassium 3.6  3.5 - 5.1 mEq/L    Chloride 100  96 - 112 mEq/L    CO2 21  19 - 32 mEq/L    Glucose, Bld 162 (*) 70 - 99 mg/dL    BUN 10  6 - 23 mg/dL    Creatinine, Ser 5.62  0.50 - 1.35 mg/dL    Calcium 9.8  8.4 - 13.0 mg/dL    Total Protein 7.6  6.0 - 8.3 g/dL    Albumin 4.0  3.5 - 5.2 g/dL    AST 32  0 - 37 U/L    ALT 24  0 - 53 U/L    Alkaline Phosphatase 80  39 - 117 U/L    Total Bilirubin 0.3  0.3 - 1.2 mg/dL    GFR calc non Af Amer >90  >90 mL/min    GFR calc Af Amer >90  >90 mL/min   TROPONIN I     Status: Normal   Collection Time   12/11/11  1:08 AM      Component Value Range Comment   Troponin I <0.30  <0.30 ng/mL     Radiology Reports: Dg Chest Portable 1 View  12/11/2011  *RADIOLOGY REPORT*  Clinical Data: Shortness of breath.   PORTABLE CHEST - 1 VIEW  Comparison: 09/08/2009.  Findings: Enlarged cardiac silhouette with a mild increase in size. Interval median sternotomy wires and single surgical clip overlying the right lower lung zone.  Clear lungs with normal vascularity. Thoracic spine degenerative changes.  IMPRESSION: Mildly progressive cardiomegaly.  No acute abnormality.   Original Report Authenticated By: Beckie Salts, M.D.     Electrocardiogram: Sinus rhythm at 84 beats per minute. Left axis deviation is present. First degree AV block is noticed. Q wave seen in inferior leads. There is T inversion in 1 and aVL. These changes are old when compared to earlier EKGs.  Assessment/Plan  Principal Problem:  *Chest pain Active Problems:  DIABETES MELLITUS, WITH NEUROLOGICAL COMPLICATIONS  HYPERTENSION  Palpitations  CAD (coronary artery disease)   Assessment: This is a 61 year old, Caucasian male, with a history of coronary artery disease, hypertension, diabetes, who has a history of palpitations. Present with dizziness and palpitations. He had chest tightness, associated with the symptoms. Currently, is symptom-free. Unclear what transpired. His EKG does not show any new changes. Venous thromboembolism is less likely considering that his symptoms have resolved. However, patient needs to be observed due to his history of coronary artery disease.  #1 palpitations with chest pain: He'll be observed. He'll be ruled out for acute coronary syndrome. TSH will be checked. Magnesium level will be checked. We'll get his potassium above 4.0. If he rules out he can get further workup as an outpatient.  #  2 history of coronary artery disease, status post CABG: Continue with aspirin. Continue with his beta blocker.  #3 diabetes, type II: Sliding scale insulin will be provided. HbA1c will be checked here.  #4 history of hypertension: Continue with his antihypertensive agents.  Further management decisions will depend on results  of further testing and patient's response to treatment.  Code Status: He is a full code Family Communication: His wife was at the, bedside  Disposition Plan: He will likely return home when improved   [facility]  [facility] Pager [phone]  If 7PM-7AM, please contact night-coverage [email or web address] Password [email or web address]  12/11/2011, 2:57 AM

## 2011-12-11 NOTE — ED Notes (Signed)
States the nitroglycerin has eased some of the discomfort he was having in his lower chest region and over his left ribs.

## 2011-12-11 NOTE — ED Notes (Signed)
Pt states he prefers to sit on the edge of the bed.  Re-discussed call light use with patient and for him not to get up without assistance.

## 2011-12-11 NOTE — ED Notes (Addendum)
Patient states that he started feeling like his heart wasn't beating like it should, states that while he was in the car on the way to the ER that he felt like he passed out, states that everything got black, but he could still hear.  States that he did have one episode of nausea without vomiting and felt like he could not breathe well.  Also states that he felt very weak and felt like his legs would not carry his weight.

## 2011-12-12 LAB — HEMOGLOBIN A1C: Mean Plasma Glucose: 137 mg/dL — ABNORMAL HIGH (ref <117)

## 2011-12-12 LAB — TSH: TSH: 2.987 u[IU]/mL (ref 0.350–4.500)

## 2013-01-03 DIAGNOSIS — G4733 Obstructive sleep apnea (adult) (pediatric): Secondary | ICD-10-CM

## 2013-01-03 HISTORY — DX: Obstructive sleep apnea (adult) (pediatric): G47.33

## 2013-04-16 ENCOUNTER — Encounter (HOSPITAL_COMMUNITY): Payer: BC Managed Care – PPO | Admitting: Anesthesiology

## 2013-04-16 ENCOUNTER — Inpatient Hospital Stay (HOSPITAL_COMMUNITY): Payer: BC Managed Care – PPO | Admitting: Anesthesiology

## 2013-04-16 ENCOUNTER — Emergency Department (HOSPITAL_COMMUNITY): Payer: BC Managed Care – PPO

## 2013-04-16 ENCOUNTER — Inpatient Hospital Stay (HOSPITAL_COMMUNITY): Payer: BC Managed Care – PPO

## 2013-04-16 ENCOUNTER — Inpatient Hospital Stay (HOSPITAL_COMMUNITY)
Admission: EM | Admit: 2013-04-16 | Discharge: 2013-04-22 | DRG: 871 | Disposition: A | Discharge status: Home / Self Care | Payer: BC Managed Care – PPO | Attending: Family Medicine | Admitting: Family Medicine

## 2013-04-16 ENCOUNTER — Encounter (HOSPITAL_COMMUNITY): Payer: Self-pay | Admitting: Emergency Medicine

## 2013-04-16 DIAGNOSIS — E119 Type 2 diabetes mellitus without complications: Secondary | ICD-10-CM | POA: Diagnosis present

## 2013-04-16 DIAGNOSIS — D649 Anemia, unspecified: Secondary | ICD-10-CM | POA: Diagnosis present

## 2013-04-16 DIAGNOSIS — J4489 Other specified chronic obstructive pulmonary disease: Secondary | ICD-10-CM | POA: Diagnosis present

## 2013-04-16 DIAGNOSIS — E872 Acidosis, unspecified: Secondary | ICD-10-CM | POA: Diagnosis present

## 2013-04-16 DIAGNOSIS — Z87891 Personal history of nicotine dependence: Secondary | ICD-10-CM

## 2013-04-16 DIAGNOSIS — R6520 Severe sepsis without septic shock: Secondary | ICD-10-CM

## 2013-04-16 DIAGNOSIS — J189 Pneumonia, unspecified organism: Secondary | ICD-10-CM | POA: Diagnosis present

## 2013-04-16 DIAGNOSIS — N183 Chronic kidney disease, stage 3 unspecified: Secondary | ICD-10-CM | POA: Diagnosis present

## 2013-04-16 DIAGNOSIS — A419 Sepsis, unspecified organism: Principal | ICD-10-CM | POA: Diagnosis present

## 2013-04-16 DIAGNOSIS — R29818 Other symptoms and signs involving the nervous system: Secondary | ICD-10-CM | POA: Diagnosis present

## 2013-04-16 DIAGNOSIS — I509 Heart failure, unspecified: Secondary | ICD-10-CM | POA: Diagnosis present

## 2013-04-16 DIAGNOSIS — E669 Obesity, unspecified: Secondary | ICD-10-CM | POA: Diagnosis present

## 2013-04-16 DIAGNOSIS — E1149 Type 2 diabetes mellitus with other diabetic neurological complication: Secondary | ICD-10-CM

## 2013-04-16 DIAGNOSIS — E1169 Type 2 diabetes mellitus with other specified complication: Secondary | ICD-10-CM | POA: Diagnosis present

## 2013-04-16 DIAGNOSIS — J449 Chronic obstructive pulmonary disease, unspecified: Secondary | ICD-10-CM | POA: Diagnosis present

## 2013-04-16 DIAGNOSIS — K589 Irritable bowel syndrome without diarrhea: Secondary | ICD-10-CM | POA: Diagnosis present

## 2013-04-16 DIAGNOSIS — I2589 Other forms of chronic ischemic heart disease: Secondary | ICD-10-CM | POA: Diagnosis present

## 2013-04-16 DIAGNOSIS — I129 Hypertensive chronic kidney disease with stage 1 through stage 4 chronic kidney disease, or unspecified chronic kidney disease: Secondary | ICD-10-CM | POA: Diagnosis present

## 2013-04-16 DIAGNOSIS — G9340 Encephalopathy, unspecified: Secondary | ICD-10-CM | POA: Diagnosis present

## 2013-04-16 DIAGNOSIS — G4733 Obstructive sleep apnea (adult) (pediatric): Secondary | ICD-10-CM | POA: Diagnosis present

## 2013-04-16 DIAGNOSIS — A498 Other bacterial infections of unspecified site: Secondary | ICD-10-CM | POA: Diagnosis present

## 2013-04-16 DIAGNOSIS — G92 Toxic encephalopathy: Secondary | ICD-10-CM | POA: Diagnosis present

## 2013-04-16 DIAGNOSIS — N390 Urinary tract infection, site not specified: Secondary | ICD-10-CM | POA: Diagnosis present

## 2013-04-16 DIAGNOSIS — I5023 Acute on chronic systolic (congestive) heart failure: Secondary | ICD-10-CM | POA: Diagnosis present

## 2013-04-16 DIAGNOSIS — R079 Chest pain, unspecified: Secondary | ICD-10-CM | POA: Diagnosis present

## 2013-04-16 DIAGNOSIS — E785 Hyperlipidemia, unspecified: Secondary | ICD-10-CM | POA: Diagnosis present

## 2013-04-16 DIAGNOSIS — I10 Essential (primary) hypertension: Secondary | ICD-10-CM | POA: Diagnosis present

## 2013-04-16 DIAGNOSIS — Z951 Presence of aortocoronary bypass graft: Secondary | ICD-10-CM

## 2013-04-16 DIAGNOSIS — N189 Chronic kidney disease, unspecified: Secondary | ICD-10-CM | POA: Diagnosis present

## 2013-04-16 DIAGNOSIS — J9600 Acute respiratory failure, unspecified whether with hypoxia or hypercapnia: Secondary | ICD-10-CM

## 2013-04-16 DIAGNOSIS — N170 Acute kidney failure with tubular necrosis: Secondary | ICD-10-CM | POA: Diagnosis present

## 2013-04-16 DIAGNOSIS — N179 Acute kidney failure, unspecified: Secondary | ICD-10-CM | POA: Diagnosis present

## 2013-04-16 DIAGNOSIS — I2510 Atherosclerotic heart disease of native coronary artery without angina pectoris: Secondary | ICD-10-CM | POA: Diagnosis present

## 2013-04-16 DIAGNOSIS — G929 Unspecified toxic encephalopathy: Secondary | ICD-10-CM | POA: Diagnosis present

## 2013-04-16 DIAGNOSIS — Z8249 Family history of ischemic heart disease and other diseases of the circulatory system: Secondary | ICD-10-CM

## 2013-04-16 HISTORY — DX: Obstructive sleep apnea (adult) (pediatric): G47.33

## 2013-04-16 LAB — BASIC METABOLIC PANEL
BUN: 71 mg/dL — ABNORMAL HIGH (ref 6–23)
CALCIUM: 9 mg/dL (ref 8.4–10.5)
CO2: 26 meq/L (ref 19–32)
CREATININE: 2.63 mg/dL — AB (ref 0.50–1.35)
Chloride: 94 mEq/L — ABNORMAL LOW (ref 96–112)
GFR calc Af Amer: 29 mL/min — ABNORMAL LOW (ref >90)
GFR, EST NON AFRICAN AMERICAN: 25 mL/min — AB (ref >90)
GLUCOSE: 70 mg/dL (ref 70–99)
Potassium: 4.4 mEq/L (ref 3.7–5.3)
Sodium: 135 mEq/L — ABNORMAL LOW (ref 137–147)

## 2013-04-16 LAB — BLOOD GAS, ARTERIAL
Acid-base deficit: 3.8 mmol/L — ABNORMAL HIGH (ref 0.0–2.0)
Acid-base deficit: 3.9 mmol/L — ABNORMAL HIGH (ref 0.0–2.0)
Bicarbonate: 24.2 mEq/L — ABNORMAL HIGH (ref 20.0–24.0)
Bicarbonate: 21.9 mEq/L (ref 20.0–24.0)
Drawn by: 234301
Drawn by: 38235
FIO2: 0.4 %
LHR: 16 {breaths}/min
O2 CONTENT: 3 L/min
O2 SAT: 91.6 %
O2 Saturation: 98.5 %
PATIENT TEMPERATURE: 37
PATIENT TEMPERATURE: 37
PCO2 ART: 49.2 mmHg — AB (ref 35.0–45.0)
PEEP: 5 cmH2O
PH ART: 7.139 — AB (ref 7.350–7.450)
PH ART: 7.271 — AB (ref 7.350–7.450)
TCO2: 23.3 mmol/L (ref 0–100)
TCO2: 20.6 mmol/L (ref 0–100)
VT: 500 mL
pCO2 arterial: 74.2 mmHg (ref 35.0–45.0)
pO2, Arterial: 82.4 mmHg (ref 80.0–100.0)
pO2, Arterial: 132 mmHg — ABNORMAL HIGH (ref 80.0–100.0)

## 2013-04-16 LAB — URINE MICROSCOPIC-ADD ON

## 2013-04-16 LAB — HEMOGLOBIN A1C
Hgb A1c MFr Bld: 8.2 % — ABNORMAL HIGH (ref <5.7)
Mean Plasma Glucose: 189 mg/dL — ABNORMAL HIGH (ref <117)

## 2013-04-16 LAB — URINALYSIS, ROUTINE W REFLEX MICROSCOPIC
BILIRUBIN URINE: NEGATIVE
GLUCOSE, UA: NEGATIVE mg/dL
Glucose, UA: NEGATIVE mg/dL
KETONES UR: NEGATIVE mg/dL
KETONES UR: NEGATIVE mg/dL
Leukocytes, UA: NEGATIVE
Leukocytes, UA: NEGATIVE
Nitrite: NEGATIVE
Nitrite: NEGATIVE
PH: 5 (ref 5.0–8.0)
Protein, ur: 100 mg/dL — AB
Protein, ur: 100 mg/dL — AB
Specific Gravity, Urine: >1.03 — ABNORMAL HIGH (ref 1.005–1.030)
Specific Gravity, Urine: >1.03 — ABNORMAL HIGH (ref 1.005–1.030)
UROBILINOGEN UA: 1 mg/dL (ref 0.0–1.0)
Urobilinogen, UA: 1 mg/dL (ref 0.0–1.0)
pH: 5 (ref 5.0–8.0)

## 2013-04-16 LAB — GLUCOSE, CAPILLARY
Glucose-Capillary: 110 mg/dL — ABNORMAL HIGH (ref 70–99)
Glucose-Capillary: 73 mg/dL (ref 70–99)

## 2013-04-16 LAB — CBC WITH DIFFERENTIAL/PLATELET
BASOS ABS: 0 10*3/uL (ref 0.0–0.1)
Basophils Relative: 0 % (ref 0–1)
EOS ABS: 0 10*3/uL (ref 0.0–0.7)
EOS PCT: 0 % (ref 0–5)
HEMATOCRIT: 39.8 % (ref 39.0–52.0)
Hemoglobin: 12.2 g/dL — ABNORMAL LOW (ref 13.0–17.0)
LYMPHS ABS: 1.6 10*3/uL (ref 0.7–4.0)
LYMPHS PCT: 13 % (ref 12–46)
MCH: 22.9 pg — AB (ref 26.0–34.0)
MCHC: 30.7 g/dL (ref 30.0–36.0)
MCV: 74.8 fL — AB (ref 78.0–100.0)
MONO ABS: 1.7 10*3/uL — AB (ref 0.1–1.0)
Monocytes Relative: 14 % — ABNORMAL HIGH (ref 3–12)
Neutro Abs: 8.9 10*3/uL — ABNORMAL HIGH (ref 1.7–7.7)
Neutrophils Relative %: 73 % (ref 43–77)
Platelets: 197 10*3/uL (ref 150–400)
RBC: 5.32 MIL/uL (ref 4.22–5.81)
RDW: 16.5 % — AB (ref 11.5–15.5)
WBC: 12.2 10*3/uL — AB (ref 4.0–10.5)

## 2013-04-16 LAB — LACTIC ACID, PLASMA: Lactic Acid, Venous: 2.3 mmol/L — ABNORMAL HIGH (ref 0.5–2.2)

## 2013-04-16 LAB — MRSA PCR SCREENING: MRSA by PCR: NEGATIVE

## 2013-04-16 LAB — TROPONIN I
Troponin I: <0.3 ng/mL (ref <0.30)
Troponin I: <0.3 ng/mL

## 2013-04-16 LAB — CBG MONITORING, ED: Glucose-Capillary: 135 mg/dL — ABNORMAL HIGH (ref 70–99)

## 2013-04-16 LAB — STREP PNEUMONIAE URINARY ANTIGEN: STREP PNEUMO URINARY ANTIGEN: NEGATIVE

## 2013-04-16 LAB — PRO B NATRIURETIC PEPTIDE: Pro B Natriuretic peptide (BNP): 8961 pg/mL — ABNORMAL HIGH (ref 0–125)

## 2013-04-16 LAB — TSH: TSH: 2.88 u[IU]/mL (ref 0.350–4.500)

## 2013-04-16 MED ORDER — SUCCINYLCHOLINE CHLORIDE 20 MG/ML IJ SOLN
INTRAMUSCULAR | Status: AC
  Administered 2013-04-16: 180 mg
  Filled 2013-04-16: qty 1

## 2013-04-16 MED ORDER — SODIUM CHLORIDE 0.9 % IV BOLUS (SEPSIS)
500.0000 mL | Freq: Once | INTRAVENOUS | Status: AC
  Administered 2013-04-16: 500 mL via INTRAVENOUS

## 2013-04-16 MED ORDER — ACETAMINOPHEN 650 MG RE SUPP
650.0000 mg | Freq: Four times a day (QID) | RECTAL | Status: DC | PRN
  Administered 2013-04-17 – 2013-04-18 (×3): 650 mg via RECTAL
  Filled 2013-04-16 (×3): qty 1

## 2013-04-16 MED ORDER — SODIUM CHLORIDE 0.9 % IJ SOLN
3.0000 mL | Freq: Two times a day (BID) | INTRAMUSCULAR | Status: DC
Start: 2013-04-16 — End: 2013-04-22
  Administered 2013-04-16 – 2013-04-22 (×10): 3 mL via INTRAVENOUS

## 2013-04-16 MED ORDER — ALBUTEROL SULFATE (2.5 MG/3ML) 0.083% IN NEBU: 2.5000 mg | INHALATION_SOLUTION | RESPIRATORY_TRACT | Status: DC

## 2013-04-16 MED ORDER — BIOTENE DRY MOUTH MT LIQD
15.0000 mL | Freq: Two times a day (BID) | OROMUCOSAL | Status: DC
  Administered 2013-04-16 – 2013-04-22 (×10): 15 mL via OROMUCOSAL

## 2013-04-16 MED ORDER — IPRATROPIUM-ALBUTEROL 0.5-2.5 (3) MG/3ML IN SOLN
3.0000 mL | RESPIRATORY_TRACT | Status: DC
  Administered 2013-04-16 – 2013-04-18 (×12): 3 mL via RESPIRATORY_TRACT
  Filled 2013-04-16 (×13): qty 3

## 2013-04-16 MED ORDER — ETOMIDATE 2 MG/ML IV SOLN
INTRAVENOUS | Status: AC
  Filled 2013-04-16: qty 10

## 2013-04-16 MED ORDER — CHLORHEXIDINE GLUCONATE 0.12 % MT SOLN
15.0000 mL | Freq: Two times a day (BID) | OROMUCOSAL | Status: DC
  Administered 2013-04-18 – 2013-04-20 (×5): 15 mL via OROMUCOSAL
  Filled 2013-04-16 (×6): qty 15

## 2013-04-16 MED ORDER — ALBUTEROL SULFATE (2.5 MG/3ML) 0.083% IN NEBU: 2.5000 mg | INHALATION_SOLUTION | Freq: Four times a day (QID) | RESPIRATORY_TRACT | Status: DC | PRN

## 2013-04-16 MED ORDER — MORPHINE SULFATE 4 MG/ML IJ SOLN
4.0000 mg | Freq: Once | INTRAMUSCULAR | Status: AC
  Administered 2013-04-16: 4 mg via INTRAVENOUS
  Filled 2013-04-16: qty 1

## 2013-04-16 MED ORDER — SUCCINYLCHOLINE CHLORIDE 20 MG/ML IJ SOLN
INTRAMUSCULAR | Status: DC | PRN
  Administered 2013-04-16: 180 mg via INTRAVENOUS

## 2013-04-16 MED ORDER — KETOROLAC TROMETHAMINE 30 MG/ML IJ SOLN
30.0000 mg | Freq: Once | INTRAMUSCULAR | Status: AC
  Administered 2013-04-16: 30 mg via INTRAVENOUS
  Filled 2013-04-16: qty 1

## 2013-04-16 MED ORDER — SUCCINYLCHOLINE CHLORIDE 20 MG/ML IJ SOLN
INTRAMUSCULAR | Status: AC
  Filled 2013-04-16: qty 1

## 2013-04-16 MED ORDER — INSULIN ASPART 100 UNIT/ML ~~LOC~~ SOLN: 0.0000 [IU] | Freq: Every day | SUBCUTANEOUS | Status: DC

## 2013-04-16 MED ORDER — PANTOPRAZOLE SODIUM 40 MG PO TBEC: 40.0000 mg | DELAYED_RELEASE_TABLET | Freq: Every day | ORAL | Status: DC

## 2013-04-16 MED ORDER — VANCOMYCIN HCL 10 G IV SOLR
2000.0000 mg | Freq: Once | INTRAVENOUS | Status: AC
  Administered 2013-04-16: 2000 mg via INTRAVENOUS
  Filled 2013-04-16: qty 2000

## 2013-04-16 MED ORDER — ENOXAPARIN SODIUM 30 MG/0.3ML ~~LOC~~ SOLN
30.0000 mg | SUBCUTANEOUS | Status: DC
  Administered 2013-04-16: 30 mg via SUBCUTANEOUS
  Filled 2013-04-16: qty 0.3

## 2013-04-16 MED ORDER — DEXTROSE 5 % IV SOLN
1.0000 g | Freq: Once | INTRAVENOUS | Status: AC
  Administered 2013-04-16: 1 g via INTRAVENOUS
  Filled 2013-04-16: qty 10

## 2013-04-16 MED ORDER — IPRATROPIUM BROMIDE 0.02 % IN SOLN
0.5000 mg | RESPIRATORY_TRACT | Status: DC
Start: 2013-04-16 — End: 2013-04-16

## 2013-04-16 MED ORDER — ETOMIDATE 2 MG/ML IV SOLN
INTRAVENOUS | Status: DC | PRN
  Administered 2013-04-16: 10 mg via INTRAVENOUS

## 2013-04-16 MED ORDER — ROCURONIUM BROMIDE 50 MG/5ML IV SOLN
INTRAVENOUS | Status: AC
  Filled 2013-04-16: qty 2

## 2013-04-16 MED ORDER — ONDANSETRON HCL 4 MG/2ML IJ SOLN: 4.0000 mg | Freq: Four times a day (QID) | INTRAMUSCULAR | Status: DC | PRN

## 2013-04-16 MED ORDER — ONDANSETRON HCL 4 MG/2ML IJ SOLN
4.0000 mg | Freq: Once | INTRAMUSCULAR | Status: AC
  Administered 2013-04-16: 4 mg via INTRAVENOUS
  Filled 2013-04-16: qty 2

## 2013-04-16 MED ORDER — ONDANSETRON HCL 4 MG PO TABS: 4.0000 mg | ORAL_TABLET | Freq: Four times a day (QID) | ORAL | Status: DC | PRN

## 2013-04-16 MED ORDER — CHLORHEXIDINE GLUCONATE 0.12 % MT SOLN
15.0000 mL | Freq: Two times a day (BID) | OROMUCOSAL | Status: DC
  Administered 2013-04-16 – 2013-04-17 (×3): 15 mL via OROMUCOSAL
  Filled 2013-04-16 (×2): qty 15

## 2013-04-16 MED ORDER — INSULIN ASPART 100 UNIT/ML ~~LOC~~ SOLN: 0.0000 [IU] | Freq: Three times a day (TID) | SUBCUTANEOUS | Status: DC

## 2013-04-16 MED ORDER — ALBUTEROL SULFATE (2.5 MG/3ML) 0.083% IN NEBU: 2.5000 mg | INHALATION_SOLUTION | RESPIRATORY_TRACT | Status: DC | PRN

## 2013-04-16 MED ORDER — LIDOCAINE HCL (CARDIAC) 20 MG/ML IV SOLN
INTRAVENOUS | Status: AC
  Filled 2013-04-16: qty 5

## 2013-04-16 MED ORDER — PANTOPRAZOLE SODIUM 40 MG IV SOLR
40.0000 mg | Freq: Every day | INTRAVENOUS | Status: DC
  Administered 2013-04-16 – 2013-04-21 (×6): 40 mg via INTRAVENOUS
  Filled 2013-04-16 (×6): qty 40

## 2013-04-16 MED ORDER — FENTANYL CITRATE 0.05 MG/ML IJ SOLN
100.0000 ug | INTRAMUSCULAR | Status: DC | PRN
  Administered 2013-04-17 – 2013-04-18 (×4): 100 ug via INTRAVENOUS
  Filled 2013-04-16 (×3): qty 2

## 2013-04-16 MED ORDER — FENTANYL CITRATE 0.05 MG/ML IJ SOLN
100.0000 ug | INTRAMUSCULAR | Status: DC | PRN
  Administered 2013-04-16 (×2): 100 ug via INTRAVENOUS
  Filled 2013-04-16 (×3): qty 2

## 2013-04-16 MED ORDER — BIOTENE DRY MOUTH MT LIQD
15.0000 mL | Freq: Four times a day (QID) | OROMUCOSAL | Status: DC
  Administered 2013-04-16 – 2013-04-17 (×2): 15 mL via OROMUCOSAL

## 2013-04-16 MED ORDER — ETOMIDATE 2 MG/ML IV SOLN
INTRAVENOUS | Status: AC
  Administered 2013-04-16: 10 mg
  Filled 2013-04-16: qty 20

## 2013-04-16 MED ORDER — DEXTROSE 5 % IV SOLN
1.0000 g | INTRAVENOUS | Status: DC
  Administered 2013-04-17 – 2013-04-21 (×5): 1 g via INTRAVENOUS
  Filled 2013-04-16 (×6): qty 10

## 2013-04-16 MED ORDER — ACETAMINOPHEN 325 MG PO TABS
650.0000 mg | ORAL_TABLET | Freq: Four times a day (QID) | ORAL | Status: DC | PRN
  Administered 2013-04-18: 650 mg via ORAL
  Filled 2013-04-16: qty 2

## 2013-04-16 MED ORDER — SODIUM CHLORIDE 0.9 % IJ SOLN
10.0000 mL | INTRAMUSCULAR | Status: DC | PRN
  Administered 2013-04-17: 20 mL
  Administered 2013-04-18: 10 mL

## 2013-04-16 MED ORDER — DEXTROSE 5 % IV SOLN
500.0000 mg | Freq: Once | INTRAVENOUS | Status: AC
  Administered 2013-04-16: 500 mg via INTRAVENOUS

## 2013-04-16 MED ORDER — SODIUM CHLORIDE 0.9 % IV SOLN
INTRAVENOUS | Status: AC
  Administered 2013-04-16 (×3): via INTRAVENOUS

## 2013-04-16 MED ORDER — SODIUM CHLORIDE 0.9 % IJ SOLN
10.0000 mL | Freq: Two times a day (BID) | INTRAMUSCULAR | Status: DC
  Administered 2013-04-16 – 2013-04-20 (×7): 10 mL

## 2013-04-16 MED ORDER — PHENYLEPHRINE HCL 10 MG/ML IJ SOLN
30.0000 ug/min | INTRAVENOUS | Status: DC
  Administered 2013-04-16: 45 ug/min via INTRAVENOUS
  Filled 2013-04-16: qty 1

## 2013-04-16 MED ORDER — DEXTROSE 5 % IV SOLN
500.0000 mg | INTRAVENOUS | Status: DC
  Administered 2013-04-17 – 2013-04-20 (×4): 500 mg via INTRAVENOUS
  Filled 2013-04-16 (×6): qty 500

## 2013-04-16 NOTE — ED Provider Notes (Signed)
CSN: [id number]     Arrival date & time 04/16/13  0917 History  This chart was scribed for Donnetta Hutching, MD by Quintella Reichert, ED scribe.  This patient was seen in room APA02/APA02 and the patient's care was started at 9:31 AM.   Chief Complaint  Patient presents with  . Chest Pain  . Shortness of Breath    The history is provided by the patient. No language interpreter was used.    HPI Comments: Level V caveat for urgent intervention. Jerry Williams is a 63 y.o. male who presents to the Emergency Department complaining of intermittent "extreme, deep back pain" that began early this morning.  Pt describes pain as "really intense, throbbing, pulsating, every time my heart beats."  Pain does not radiate into legs, chest, or abdomen.  He denies chest pain currently.  He notes that a few days ago he experienced pain "deep in the bottom of my stomach" on the right side, but this has since resolved.  He denies appetite change or urinary symptoms.        Further history from wife. She reports he has not felt well since Thursday. He has been weak, poor appetite, decreased ambulation, generalized malaise.  Past Medical History  Diagnosis Date  . Hypertension   . Diabetes mellitus   . Colon polyp   . Obese   . PVC's (premature ventricular contractions)   . IBS (irritable bowel syndrome)   . Hyperlipidemia   . PONV (postoperative nausea and vomiting)   . Chronic kidney disease   . Coronary artery disease   . COPD (chronic obstructive pulmonary disease)     Past Surgical History  Procedure Laterality Date  . Cardiac catheterization    . Cystectomy      Scrotal abscess  . Coronary artery bypass graft    . Cholecystectomy  02/14/2011    Procedure: LAPAROSCOPIC CHOLECYSTECTOMY;  Surgeon: Fabio Bering, MD;  Location: [facility];  Service: General;  Laterality: N/A;    Family History  Problem Relation Age of Onset  . Hypertension Mother   . Emphysema Father   . Heart disease Sister      Enlarged heart    History  Substance Use Topics  . Smoking status: Former Games developer  . Smokeless tobacco: Not on file     Comment: Smoked 20 years on and off, quit >17 years ago  . Alcohol Use: No     Comment: Regular/ heavy use for 20 years, quit >17 years ago     Review of Systems A complete 10 system review of systems was obtained and all systems are negative except as noted in the HPI and PMH.     Allergies  Review of patient's allergies indicates no known allergies.  Home Medications   Prior to Admission medications   Medication Sig Start Date End Date Taking? Authorizing Provider  aspirin 325 MG EC tablet Take 325 mg by mouth at bedtime.     Historical Provider, MD  benazepril (LOTENSIN) 5 MG tablet Take 5 mg by mouth daily.    Historical Provider, MD  dicyclomine (BENTYL) 20 MG tablet Take 20 mg by mouth 4 (four) times daily as needed. For stomach    Historical Provider, MD  esomeprazole (NEXIUM) 40 MG capsule Take 40 mg by mouth daily with supper. At 6pm    Historical Provider, MD  lovastatin (MEVACOR) 20 MG tablet Take 20 mg by mouth at bedtime.    Historical Provider, MD  Magnesium 250  MG TABS Take 1 tablet by mouth daily.      Historical Provider, MD  metoprolol (LOPRESSOR) 100 MG tablet Take 100 mg by mouth 2 (two) times daily. **Take 150 mg by mouth twice daily. Take one 50 mg  Tablet with one 100 mg tablet to total 150 mg twice daily**    Historical Provider, MD  metoprolol (LOPRESSOR) 50 MG tablet Take 50 mg by mouth 2 (two) times daily. **Take 150 mg by mouth twice daily. Take one 50 mg  Tablet with one 100 mg tablet to total 150 mg twice daily**    Historical Provider, MD  metoprolol (LOPRESSOR) 50 MG tablet Take 0.5 tablets (25 mg total) by mouth 2 (two) times daily as needed (heart racing). 12/11/11   Delfina Redwood, MD  OVER THE COUNTER MEDICATION Take 1 tablet by mouth 3 (three) times daily with meals. "Dietary Supplement-Key 1-Raspberry flavored"    Historical  Provider, MD  polycarbophil (FIBERCON) 625 MG tablet Take 1,250 mg by mouth daily with supper. At 6pm    Historical Provider, MD  Repaglinide (PRANDIN PO) Take 1 tablet by mouth every morning.    Historical Provider, MD  sitaGLIPtan-metformin (JANUMET) 50-1000 MG per tablet Take 1 tablet by mouth 2 (two) times daily with a meal.      Historical Provider, MD   BP 152/76  Pulse 106  Temp(Src) 98.1 F (36.7 C) (Oral)  Resp 24  SpO2 95%  Physical Exam  Nursing note and vitals reviewed. Constitutional: He is oriented to person, place, and time. He appears well-developed and well-nourished.  Obese Does not appear to be in acute distress  HENT:  Head: Normocephalic and atraumatic.  Eyes: Conjunctivae and EOM are normal. Pupils are equal, round, and reactive to light.  Neck: Normal range of motion. Neck supple.  Cardiovascular: Normal rate, regular rhythm and normal heart sounds.   Pulmonary/Chest: Effort normal and breath sounds normal.  Abdominal: Soft. Bowel sounds are normal.  Musculoskeletal: Normal range of motion.  Lower back nontender to palpation  Neurological: He is alert and oriented to person, place, and time.  Skin: Skin is warm and dry.  Psychiatric: He has a normal mood and affect. His behavior is normal.    ED Course  Procedures (including critical care time)  DIAGNOSTIC STUDIES: Oxygen Saturation is 95% on [place], adequate by my interpretation.    COORDINATION OF CARE: 9:39 AM-Discussed treatment plan which includes pain medication, anti-emetics, and acute abdominal series with pt at bedside and pt agreed to plan.     Results for orders placed during the hospital encounter of 04/16/13  CBC WITH DIFFERENTIAL      Result Value Ref Range   WBC 12.2 (*) 4.0 - 10.5 K/uL   RBC 5.32  4.22 - 5.81 MIL/uL   Hemoglobin 12.2 (*) 13.0 - 17.0 g/dL   HCT 39.8  39.0 - 52.0 %   MCV 74.8 (*) 78.0 - 100.0 fL   MCH 22.9 (*) 26.0 - 34.0 pg   MCHC 30.7  30.0 - 36.0 g/dL   RDW 16.5  (*) 11.5 - 15.5 %   Platelets 197  150 - 400 K/uL   Neutrophils Relative % 73  43 - 77 %   Neutro Abs 8.9 (*) 1.7 - 7.7 K/uL   Lymphocytes Relative 13  12 - 46 %   Lymphs Abs 1.6  0.7 - 4.0 K/uL   Monocytes Relative 14 (*) 3 - 12 %   Monocytes Absolute 1.7 (*) 0.1 -  1.0 K/uL   Eosinophils Relative 0  0 - 5 %   Eosinophils Absolute 0.0  0.0 - 0.7 K/uL   Basophils Relative 0  0 - 1 %   Basophils Absolute 0.0  0.0 - 0.1 K/uL  BASIC METABOLIC PANEL      Result Value Ref Range   Sodium 135 (*) 137 - 147 mEq/L   Potassium 4.4  3.7 - 5.3 mEq/L   Chloride 94 (*) 96 - 112 mEq/L   CO2 26  19 - 32 mEq/L   Glucose, Bld 70  70 - 99 mg/dL   BUN 71 (*) 6 - 23 mg/dL   Creatinine, Ser 2.63 (*) 0.50 - 1.35 mg/dL   Calcium 9.0  8.4 - 10.5 mg/dL   GFR calc non Af Amer 25 (*) >90 mL/min   GFR calc Af Amer 29 (*) >90 mL/min  TROPONIN I      Result Value Ref Range   Troponin I <0.30  <0.30 ng/mL  URINALYSIS, ROUTINE W REFLEX MICROSCOPIC      Result Value Ref Range   Color, Urine BROWN (*) YELLOW   APPearance HAZY (*) CLEAR   Specific Gravity, Urine >1.030 (*) 1.005 - 1.030   pH 5.0  5.0 - 8.0   Glucose, UA NEGATIVE  NEGATIVE mg/dL   Hgb urine dipstick SMALL (*) NEGATIVE   Bilirubin Urine NEGATIVE  NEGATIVE   Ketones, ur NEGATIVE  NEGATIVE mg/dL   Protein, ur 100 (*) NEGATIVE mg/dL   Urobilinogen, UA 1.0  0.0 - 1.0 mg/dL   Nitrite NEGATIVE  NEGATIVE   Leukocytes, UA NEGATIVE  NEGATIVE  URINE MICROSCOPIC-ADD ON      Result Value Ref Range   Squamous Epithelial / LPF FEW (*) RARE   WBC, UA 7-10  <3 WBC/hpf   RBC / HPF 0-2  <3 RBC/hpf   Bacteria, UA FEW (*) RARE   Casts GRANULAR CAST (*) NEGATIVE   Urine-Other AMORPHOUS URATES/PHOSPHATES      Dg Chest 2 View  04/16/2013   CLINICAL DATA:  Shortness of breath and chest pain  EXAM: CHEST  2 VIEW  COMPARISON:  Study obtained earlier in the day  FINDINGS: There is patchy consolidation in the right lower lobe. There is atelectatic change in the  lingula. There is a small right pleural effusion. Elsewhere lungs are clear. Heart is upper normal in size with normal pulmonary vascularity. No adenopathy. Patient is status post coronary artery bypass grafting.  IMPRESSION: Patchy right lower lobe infiltrate. Lingular atelectatic change. Small right pleural effusion.   Electronically Signed   By: Lowella Grip M.D.   On: 04/16/2013 13:03    Dg Abd Acute W/chest  04/16/2013   CLINICAL DATA:  low back pain  Is  EXAM: ACUTE ABDOMEN SERIES (ABDOMEN 2 VIEW & CHEST 1 VIEW)  COMPARISON:  DG CHEST 1V PORT dated 12/11/2011  FINDINGS: There is no evidence of dilated bowel loops or free intraperitoneal air. No radiopaque calculi or other significant radiographic abnormality is seen.  Cardiac silhouette is enlarged, patient is status post median sternotomy. There is increased density within the lung bases, right greater than left with blunting of the right costophrenic angle.  IMPRESSION: Negative abdominal radiographs.  Atelectasis versus infiltrate within the lung bases right greater than left as well small right effusion. Further evaluation with dedicated two view chest radiograph recommended  Cardiomegaly   Electronically Signed   By: Margaree Mackintosh M.D.   On: 04/16/2013 11:10  EKG Interpretation   Date/Time:  Tuesday April 16 2013 09:17:13 EDT Ventricular Rate:  111 PR Interval:  204 QRS Duration: 120 QT Interval:  322 QTC Calculation: 437 R Axis:   -40 Text Interpretation:  Sinus tachycardia with occasional Premature  ventricular complexes Left axis deviation Left ventricular hypertrophy  with QRS widening and repolarization abnormality Possible Lateral infarct  (cited on or before 11-Feb-2011) Abnormal ECG When compared with ECG of  11-Dec-2011 00:43, Significant changes have occurred Confirmed by Lacinda Axon   MD, Lanore Renderos ([id number]) on 04/16/2013 11:11:53 AM     CRITICAL CARE Performed by: Nat Christen Total critical care time: 30 Critical care  time was exclusive of separately billable procedures and treating other patients. Critical care was necessary to treat or prevent imminent or life-threatening deterioration. Critical care was time spent personally by me on the following activities: development of treatment plan with patient and/or surrogate as well as nursing, discussions with consultants, evaluation of patient's response to treatment, examination of patient, obtaining history from patient or surrogate, ordering and performing treatments and interventions, ordering and review of laboratory studies, ordering and review of radiographic studies, pulse oximetry and re-evaluation of patient's condition.    Final diagnoses:  Community acquired pneumonia   I suspect his primary medical issue is pneumonia.  Rx IV Rocephin/Zithromax.  Hydrate.  Admit to Step Down     I personally performed the services described in this documentation, which was scribed in my presence. The recorded information has been reviewed and is accurate.    Nat Christen, MD 04/16/13 1410

## 2013-04-16 NOTE — ED Notes (Signed)
Dr. Lacinda Axon notified of pt's decreased BP.

## 2013-04-16 NOTE — Progress Notes (Signed)
At 1629 patients SBP noted to be in the 70's and dropping. Dr. Roderic Palau notified. Orders given to give a total of 2L bolus including what had been started in ED. MD present to assess patient and orders given to intubate. Intubation mediations given by CRNA at 1735 and patient intubated at 1738 #8, 24 at the lip with positive color change and bilateral breath sounds noted by RT.

## 2013-04-16 NOTE — H&P (Signed)
[facility] History and Physical  Jerry NIEHOFF JQB:[id number] DOB: 05-31-1950 DOA: 04/16/2013  Referring physician:  PCP: No primary provider on file.   Chief Complaint: Generalized weakness/shortness of breath/chest pain  HPI: Jerry Williams is a 63 y.o. male with a past medical history of coronary artery disease status post CABG in 2012, diabetes, hypertension, COPD since emergency department with chief complaint of worsening shortness of breath, generalized weakness and chest pain. Initial evaluation in the emergency room reveals patient with acute respiratory failure and septic with leukocytosis, hypotension and tachycardia likely related to pneumonia.  Wife reports that patient has "not felt well" for the last 6 days. He has gotten progressively weaker with decreased appetite. She reports chills and fever of 100.1. Over the last 2 days he has developed shortness of breath and intermittent coughing associated with chest pain. The pain is described as a tightness on the right side. Last night he developed some dizziness but denies syncope or near-syncope.  Associated symptoms also include some nausea without vomiting and hyperglycemia and diarrhea. In addition there has been gradual onset of lower extremity edema While he has a history of COPD is not on oxygen at home.  Basic metabolic panel significant for a creatinine of 2.63 sodium of 135. Complete blood count significant for white count of 12.2, urinalysis with few bacteria 7-10 WBCs. Chest x-ray reveals patchy right lower lobe infiltrate. Lingular atelectatic change. Small right pleural effusion. EKG with normal sinus rhythm occasional PVC. Vital signs significant for blood pressure of 89/51 heart rate of 106, oxygen saturation level of 91% on 4 L nasal cannula. He is afebrile. In the emergency department he is given and a half liters of normal saline intravenously, Rocephin and azithromycin, Toradol and morphine.   Review of  Systems:  10 point review of systems completed the otherwise all systems negative except as indicated in the history of present illness   Past Medical History  Diagnosis Date  . Hypertension   . Diabetes mellitus   . Colon polyp   . Obese   . PVC's (premature ventricular contractions)   . IBS (irritable bowel syndrome)   . Hyperlipidemia   . PONV (postoperative nausea and vomiting)   . Chronic kidney disease   . Coronary artery disease   . COPD (chronic obstructive pulmonary disease)    Past Surgical History  Procedure Laterality Date  . Cardiac catheterization    . Cystectomy      Scrotal abscess  . Coronary artery bypass graft    . Cholecystectomy  02/14/2011    Procedure: LAPAROSCOPIC CHOLECYSTECTOMY;  Surgeon: Donato Heinz, MD;  Location: [facility];  Service: General;  Laterality: N/A;   Social History:  reports that he has quit smoking. He does not have any smokeless tobacco history on file. He reports that he does not drink alcohol or use illicit drugs.  No Known Allergies  Family History  Problem Relation Age of Onset  . Hypertension Mother   . Emphysema Father   . Heart disease Sister     Enlarged heart     Prior to Admission medications   Medication Sig Start Date End Date Taking? Authorizing Provider  albuterol (PROVENTIL) (2.5 MG/3ML) 0.083% nebulizer solution Take 2.5 mg by nebulization every 6 (six) hours as needed for wheezing or shortness of breath.   Yes Historical Provider, MD  ALOE VERA PO Take 1 capsule by mouth 2 (two) times daily.   Yes Historical Provider, MD  aspirin 325 MG  EC tablet Take 325 mg by mouth at bedtime.    Yes Historical Provider, MD  benazepril-hydrochlorthiazide (LOTENSIN HCT) 10-12.5 MG per tablet Take 1 tablet by mouth daily.   Yes Historical Provider, MD  esomeprazole (NEXIUM) 40 MG capsule Take 40 mg by mouth daily with supper. At 6pm   Yes Historical Provider, MD  lovastatin (MEVACOR) 20 MG tablet Take 20 mg by mouth at bedtime.    Yes Historical Provider, MD  Magnesium 500 MG TABS Take 500 mg by mouth 2 (two) times daily.   Yes Historical Provider, MD  metoprolol (LOPRESSOR) 100 MG tablet Take 100 mg by mouth 2 (two) times daily. **Take 175mg  twice a day takes with 50mg  for a total 175mg **   Yes Historical Provider, MD  metoprolol (LOPRESSOR) 50 MG tablet Take 75 mg by mouth 2 (two) times daily. **Take 175mg  twice a day take with 100mg   For a total of 175mg **   Yes Historical Provider, MD  OVER THE COUNTER MEDICATION Take 1 tablet by mouth 3 (three) times daily with meals. "Dietary Supplement-Key 1-Raspberry flavored"   Yes Historical Provider, MD  polycarbophil (FIBERCON) 625 MG tablet Take 1,250 mg by mouth daily with supper. At 6pm   Yes Historical Provider, MD  repaglinide (PRANDIN) 2 MG tablet Take 4 mg by mouth 3 (three) times daily.   Yes Historical Provider, MD  sitaGLIPtan-metformin (JANUMET) 50-1000 MG per tablet Take 1 tablet by mouth 2 (two) times daily with a meal.     Yes Historical Provider, MD  dicyclomine (BENTYL) 20 MG tablet Take 20 mg by mouth 3 (three) times daily as needed for spasms. For stomach    Historical Provider, MD   Physical Exam: Filed Vitals:   04/16/13 1430  BP: 92/55  Pulse: 78  Temp:   Resp: 20    BP 92/55  Pulse 78  Temp(Src) 97.6 F (36.4 C) (Oral)  Resp 20  SpO2 94%  General:  Obese lethargic diphoretic Eyes: PERRL, normal lids, irises & conjunctiva ENT: PERRL, EOMI  Neck: no LAD, masses or thyromegaly Cardiovascular: RRR, no MGR 1+ LE edema  Respiratory: Mild increased work of breathing, shallow, distant BS. Decreased air flow. Some crackles right base. Abdomen: obese soft +BS non-tender Skin: no rash or induration seen on limited exam Musculoskeletal: grossly normal tone BUE/BLE Psychiatric: grossly normal mood and affect, speech fluent and appropriate Neurologic: obtunded, unable to follow commands          Labs on Admission:  Basic Metabolic Panel:  Recent  Labs Lab 04/16/13 1227  NA 135*  K 4.4  CL 94*  CO2 26  GLUCOSE 70  BUN 71*  CREATININE 2.63*  CALCIUM 9.0   Liver Function Tests: No results found for this basename: AST, ALT, ALKPHOS, BILITOT, PROT, ALBUMIN,  in the last 168 hours No results found for this basename: LIPASE, AMYLASE,  in the last 168 hours No results found for this basename: AMMONIA,  in the last 168 hours CBC:  Recent Labs Lab 04/16/13 1227  WBC 12.2*  NEUTROABS 8.9*  HGB 12.2*  HCT 39.8  MCV 74.8*  PLT 197   Cardiac Enzymes:  Recent Labs Lab 04/16/13 1227  TROPONINI <0.30    BNP (last 3 results) No results found for this basename: PROBNP,  in the last 8760 hours CBG:  Recent Labs Lab 04/16/13 1507  GLUCAP 135*    Radiological Exams on Admission: Dg Chest 2 View  04/16/2013   CLINICAL DATA:  Shortness of breath  and chest pain  EXAM: CHEST  2 VIEW  COMPARISON:  Study obtained earlier in the day  FINDINGS: There is patchy consolidation in the right lower lobe. There is atelectatic change in the lingula. There is a small right pleural effusion. Elsewhere lungs are clear. Heart is upper normal in size with normal pulmonary vascularity. No adenopathy. Patient is status post coronary artery bypass grafting.  IMPRESSION: Patchy right lower lobe infiltrate. Lingular atelectatic change. Small right pleural effusion.   Electronically Signed   By: Lowella Grip M.D.   On: 04/16/2013 13:03   Dg Abd Acute W/chest  04/16/2013   CLINICAL DATA:  low back pain  Is  EXAM: ACUTE ABDOMEN SERIES (ABDOMEN 2 VIEW & CHEST 1 VIEW)  COMPARISON:  DG CHEST 1V PORT dated 12/11/2011  FINDINGS: There is no evidence of dilated bowel loops or free intraperitoneal air. No radiopaque calculi or other significant radiographic abnormality is seen.  Cardiac silhouette is enlarged, patient is status post median sternotomy. There is increased density within the lung bases, right greater than left with blunting of the right  costophrenic angle.  IMPRESSION: Negative abdominal radiographs.  Atelectasis versus infiltrate within the lung bases right greater than left as well small right effusion. Further evaluation with dedicated two view chest radiograph recommended  Cardiomegaly   Electronically Signed   By: Margaree Mackintosh M.D.   On: 04/16/2013 11:10    EKG: Independently reviewed.  Assessment/Plan Principal Problem:   Sepsis: likely related to CAP and UTI. Will admit to SD, continue fluid rescucitiation, obtain lactic acid, strep pneumo urine antigen, legionella urine antigen and blood cultures. Will obtain sputum culture when available. Will continue Rocephin and azithromycin and add vancomycin per pharmacy. Will get ABG and request a PICC line in case pressors are needed. Hold his home antihypertensives. Active Problems:  Acute respiratory failure: Likely related to #1 in the setting of CPAP and small right pleural effusion. Patient does not wear oxygen at home. As his saturations 94% on 4 L nasal cannula. he does have a history of being evaluated for obstructive sleep apnea and BiPAP has been recommended however he does not use to 2 finances. Continue oxygen supplementation. Will obtain an ABG to assess CO2 level as patient is quite lethargic in the emergency room. He very well may need BiPAP or intubation.  CAP (community acquired pneumonia): See above.   Acute renal failure: Likely related to above.Hstory of renal stents on the right 5 years ago. Chart review indicates creatinine and BUN within limits of normal a year ago. Will hold any nephrotoxins. Its her intake and output. Check in the morning.  Acute encephalopathy. Likely related to #1 and #2. Will obtain a stat ABG to evaluate CO2 level. See #1 and #2  Chest pain: I suspect pleuritic pain given the above. Initial troponin negative. We'll cycle cardiac enzymes. Will also request a 2-D echo given worsening lower extremity edema. Home medications include 175  mg of metoprolol as well as asa and statin. Will hold this for now. Repeat his EKG in the a.m.  Lower extremity edema: etiology unclear but may be related to possible right sided heart failure as result of presumed pulmonary hypertension in setting of untreated obstructive sleep apnea. Will obtain 2 decho. Unable to diurese at this time due to hypotension. Will obtain daily weights and monitor intake and output. Home med include HCTZ, will hold this for now due to above.      DIABETES MELLITUS, WITH NEUROLOGICAL COMPLICATIONS:  Serum glucose 70 on admission. Will hold his home medications and use sliding scale insulin for optimal glycemic control. Will obtain a hemoglobin A 1C. Takes oral agents at time.    HYPERTENSION: Hypotensive on admission due to #1 and #3. I medications include benazepril, hydrochlorothiazide, metoprolol. Will hold all of these for now.      CAD (coronary artery disease): See above.    COPD (chronic obstructive pulmonary disease): Former smoker. Stopped smoking in 1989. Her medication includes albuterol nebulizer. See #1 and #2     Hyperlipidemia: On a statin at time    Obese        Code Status: full Family Communication: wife and daughters at home Disposition Plan: to be determined Time spent: 23 minutes  [facility]

## 2013-04-16 NOTE — Brief Op Note (Signed)
Controlled ICU intubation:  Obtunded pt spontaneously breathing with VSS. 93/48,64,100% SaO2...pre O2 x 3 min.... Amidate 10 mg,Anectine 180 mg.   Easy intubation with Mac 3 and 8.0 ETT. BBS and no change in post procedure vital signs.

## 2013-04-16 NOTE — Progress Notes (Signed)
ANTIBIOTIC CONSULT NOTE - INITIAL  Pharmacy Consult for Vancomycin and Rocephin Indication: pneumonia  No Known Allergies  Patient Measurements:   Last recorded weight = 108Kg  Vital Signs: Temp: 97.6 F (36.4 C) (04/14 1351) Temp src: Oral (04/14 1351) BP: 110/47 mmHg (04/14 1600) Pulse Rate: 74 (04/14 1600) Intake/Output from previous day:   Intake/Output from this shift:    Labs:  Recent Labs  04/16/13 1227  WBC 12.2*  HGB 12.2*  PLT 197  CREATININE 2.63*   The CrCl is unknown because both a height and weight (above a minimum accepted value) are required for this calculation. No results found for this basename: VANCOTROUGH, VANCOPEAK, VANCORANDOM, GENTTROUGH, GENTPEAK, GENTRANDOM, TOBRATROUGH, TOBRAPEAK, TOBRARND, AMIKACINPEAK, AMIKACINTROU, AMIKACIN,  in the last 72 hours   Microbiology: No results found for this or any previous visit (from the past 720 hour(s)).  Medical History: Past Medical History  Diagnosis Date  . Hypertension   . Diabetes mellitus   . Colon polyp   . Obese   . PVC's (premature ventricular contractions)   . IBS (irritable bowel syndrome)   . Hyperlipidemia   . PONV (postoperative nausea and vomiting)   . Chronic kidney disease   . Coronary artery disease   . COPD (chronic obstructive pulmonary disease)     Medications:  Scheduled:  . antiseptic oral rinse  15 mL Mouth Rinse q12n4p  . [START ON 04/17/2013] azithromycin  500 mg Intravenous Q24H  . [START ON 04/17/2013] cefTRIAXone (ROCEPHIN)  IV  1 g Intravenous Q24H  . chlorhexidine  15 mL Mouth Rinse BID  . enoxaparin (LOVENOX) injection  40 mg Subcutaneous Q24H  . insulin aspart  0-15 Units Subcutaneous TID WC  . insulin aspart  0-5 Units Subcutaneous QHS  . pantoprazole  40 mg Oral Daily  . sodium chloride  500 mL Intravenous Once  . sodium chloride  500 mL Intravenous Once  . sodium chloride  3 mL Intravenous Q12H  . vancomycin  2,000 mg Intravenous Once    Assessment: 63yo male admitted with sepsis likely related to pneumonia.  SCr is elevated on admission: 2.63  Pt received Zithromax and Rocephin on admission.  Normalized ClCr ~ 29ml/min  Zithromax 4/14 >> Rocephin 4/14 >> Vancomycin 4/14 >>  Goal of Therapy:  Vancomycin trough level 15-20 mcg/ml Eradicate infection.  Plan:  Vancomycin 2000mg  IV loading dose today x 1 F/U SCr tomorrow to assess renal fxn and order maintenance dose Rocephin 1gm IV q24hrs (no renal adjustment needed) Monitor labs, renal fxn, and cultures  Ena Dawley 04/16/2013,4:37 PM

## 2013-04-16 NOTE — H&P (Signed)
Patient seen and examined.  Above note reviewed.  Patient presents with shortness of breath, lethargy and generally feeling unwell.  His chest xray shows RLL pneumonia with possible parapneumonic effusion.  He was noted to be short of breath with mild hypotension in the ED. Patient was apparently awake and able to answer questions on arrival. Since his admission, patient has quickly declined.  He has become increasingly somnolent to the point that he is unresponsive.  ABG shows pH of 7.13 and PCO2 in the 70s. Lactic acid is mildly elevated, and he is now hypotensive with a systolic blood pressure in the 60-70s.  Patient now has acute respiratory failure with acute respiratory acidosis.  He has sepsis from an underlying community acquired pneumonia.  Due to his respiratory acidosis, he has developed a metabolic encephalopathy and is now unresponsive. He was initially started on bipap, but will now require intubation with mechanical ventilation for airway protection and respiratory support. Patient continues to be hypotensive.  He is receiving aggressive IV hydration for dehydration and sepsis.  Dr. Arnoldo Morale has kindly agreed to help place a central line for venous access.  He will likely need initiation of vasopressors and CVP readings.  Will also consult Dr. Luan Pulling for assistance with vent management.  The patient does have some signs of peripheral edema.  Echocardiogram is pending.  He possibly has some right sided heart failure due to pulmonary hypertension from his untreated sleep apnea/obesity hypoventilation.  Family reports that he has had a sleep study done and they recommended Bipap QHS, but he has not been using this due to financial reasons.    Acute renal failure is likely due to sepsis and hypotension.  Continue to follow with IV hydration.  Will monitor closely in ICU  Critical care: 60 mins  [employer]

## 2013-04-16 NOTE — Procedures (Signed)
Central Venous Catheter Insertion Procedure Note KALIEF KATTNER [id number] 1950/10/12  Procedure: Insertion of Central Venous Catheter Indications: Assessment of intravascular volume, Drug and/or fluid administration and Frequent blood sampling  Procedure Details Consent: Risks of procedure as well as the alternatives and risks of each were explained to the (patient/caregiver).  Consent for procedure obtained. Time Out: Verified patient identification, verified procedure, site/side was marked, verified correct patient position, special equipment/implants available, medications/allergies/relevent history reviewed, required imaging and test results available.  Performed  Maximum sterile technique was used including antiseptics, cap, gloves, gown, hand hygiene, mask and sheet. Skin prep: Chlorhexidine; local anesthetic administered A antimicrobial bonded/coated triple lumen catheter was placed in the left subclavian vein using the Seldinger technique.  Evaluation Blood flow good Complications: No apparent complications Patient did tolerate procedure well. Chest X-ray ordered to verify placement.  CXR: pending.  Jamesetta So 04/16/2013, 6:30 PM

## 2013-04-16 NOTE — ED Notes (Signed)
Pt now denies any chest pain. Pt reports relief from breathing tx he had pta via EMS.

## 2013-04-16 NOTE — ED Notes (Signed)
Pt comes from [facility] via EMS. Pt was at [facility] due to back pain and SOB which began early this morning. Pt states SOB became worse in route to ED. Pt reports intermittent central chest pain that also began this morning. Pt received 5mg  albuterol breathing tx via EMS. Pt denies relief from tx. Pt has hx of COPD, CHF, and multiple cardiac sx. Pt also presents with bilateral leg swelling. Pt is A&Ox4.

## 2013-04-16 NOTE — Anesthesia Procedure Notes (Signed)
Procedure Name: Intubation Date/Time: 04/16/2013 5:38 PM Performed by: Charmaine Downs Pre-anesthesia Checklist: Suction available, Patient being monitored, Emergency Drugs available and Patient identified Patient Re-evaluated:Patient Re-evaluated prior to inductionOxygen Delivery Method: Circle system utilized Preoxygenation: Pre-oxygenation with 100% oxygen Intubation Type: Inhalational induction with existing ETT, Cricoid Pressure applied and Rapid sequence Ventilation: Oral airway inserted - appropriate to patient size and Mask ventilation without difficulty Laryngoscope Size: Mac and 3 Grade View: Grade I Tube type: Oral Tube size: 8.0 mm Number of attempts: 1 Airway Equipment and Method: Stylet Placement Confirmation: ETT inserted through vocal cords under direct vision,  positive ETCO2 and breath sounds checked- equal and bilateral Secured at: 24 cm Tube secured with: Tape Dental Injury: Teeth and Oropharynx as per pre-operative assessment

## 2013-04-17 ENCOUNTER — Encounter (HOSPITAL_COMMUNITY): Payer: BC Managed Care – PPO

## 2013-04-17 ENCOUNTER — Inpatient Hospital Stay (HOSPITAL_COMMUNITY): Payer: BC Managed Care – PPO

## 2013-04-17 DIAGNOSIS — I369 Nonrheumatic tricuspid valve disorder, unspecified: Secondary | ICD-10-CM

## 2013-04-17 LAB — COMPREHENSIVE METABOLIC PANEL
ALT: 17 U/L (ref 0–53)
AST: 40 U/L — ABNORMAL HIGH (ref 0–37)
Albumin: 2.2 g/dL — ABNORMAL LOW (ref 3.5–5.2)
Alkaline Phosphatase: 110 U/L (ref 39–117)
BUN: 77 mg/dL — ABNORMAL HIGH (ref 6–23)
CALCIUM: 7.9 mg/dL — AB (ref 8.4–10.5)
CO2: 26 meq/L (ref 19–32)
CREATININE: 2.8 mg/dL — AB (ref 0.50–1.35)
Chloride: 98 mEq/L (ref 96–112)
GFR, EST AFRICAN AMERICAN: 27 mL/min — AB (ref >90)
GFR, EST NON AFRICAN AMERICAN: 23 mL/min — AB (ref >90)
GLUCOSE: 56 mg/dL — AB (ref 70–99)
Potassium: 4.3 mEq/L (ref 3.7–5.3)
Sodium: 136 mEq/L — ABNORMAL LOW (ref 137–147)
TOTAL PROTEIN: 6.5 g/dL (ref 6.0–8.3)
Total Bilirubin: 0.5 mg/dL (ref 0.3–1.2)

## 2013-04-17 LAB — GLUCOSE, CAPILLARY
GLUCOSE-CAPILLARY: 70 mg/dL (ref 70–99)
GLUCOSE-CAPILLARY: 123 mg/dL — AB (ref 70–99)
Glucose-Capillary: 166 mg/dL — ABNORMAL HIGH (ref 70–99)

## 2013-04-17 LAB — CBC
HCT: 35.9 % — ABNORMAL LOW (ref 39.0–52.0)
Hemoglobin: 10.8 g/dL — ABNORMAL LOW (ref 13.0–17.0)
MCH: 22.8 pg — ABNORMAL LOW (ref 26.0–34.0)
MCHC: 30.1 g/dL (ref 30.0–36.0)
MCV: 75.7 fL — ABNORMAL LOW (ref 78.0–100.0)
Platelets: 172 10*3/uL (ref 150–400)
RBC: 4.74 MIL/uL (ref 4.22–5.81)
RDW: 16.6 % — ABNORMAL HIGH (ref 11.5–15.5)
WBC: 7.9 10*3/uL (ref 4.0–10.5)

## 2013-04-17 LAB — TROPONIN I: Troponin I: <0.3 ng/mL (ref <0.30)

## 2013-04-17 MED ORDER — PHENYLEPHRINE HCL 10 MG/ML IJ SOLN
30.0000 ug/min | INTRAVENOUS | Status: DC
  Administered 2013-04-17: 100 ug/min via INTRAVENOUS
  Administered 2013-04-18: 80 ug/min via INTRAVENOUS
  Filled 2013-04-17 (×2): qty 4

## 2013-04-17 MED ORDER — PHENYLEPHRINE HCL 10 MG/ML IJ SOLN
INTRAMUSCULAR | Status: AC
  Filled 2013-04-17: qty 2

## 2013-04-17 MED ORDER — INSULIN ASPART 100 UNIT/ML ~~LOC~~ SOLN
0.0000 [IU] | Freq: Four times a day (QID) | SUBCUTANEOUS | Status: DC
  Administered 2013-04-17: 3 [IU] via SUBCUTANEOUS
  Administered 2013-04-18: 5 [IU] via SUBCUTANEOUS
  Administered 2013-04-18: 3 [IU] via SUBCUTANEOUS
  Administered 2013-04-18: 5 [IU] via SUBCUTANEOUS
  Administered 2013-04-18 – 2013-04-19 (×3): 3 [IU] via SUBCUTANEOUS
  Administered 2013-04-19: 11 [IU] via SUBCUTANEOUS
  Administered 2013-04-19: 8 [IU] via SUBCUTANEOUS

## 2013-04-17 MED ORDER — DEXTROSE-NACL 5-0.45 % IV SOLN
INTRAVENOUS | Status: DC
  Administered 2013-04-17 – 2013-04-18 (×3): via INTRAVENOUS

## 2013-04-17 MED ORDER — VANCOMYCIN HCL 10 G IV SOLR
1500.0000 mg | INTRAVENOUS | Status: DC
  Filled 2013-04-17: qty 1500

## 2013-04-17 MED ORDER — INSULIN ASPART 100 UNIT/ML ~~LOC~~ SOLN: 0.0000 [IU] | Freq: Four times a day (QID) | SUBCUTANEOUS | Status: DC

## 2013-04-17 MED ORDER — PHENYLEPHRINE HCL 10 MG/ML IJ SOLN
30.0000 ug/min | INTRAMUSCULAR | Status: DC
  Administered 2013-04-17: 30 ug/min via INTRAVENOUS
  Administered 2013-04-17: 65 ug/min via INTRAVENOUS
  Filled 2013-04-17 (×2): qty 2

## 2013-04-17 MED ORDER — HEPARIN SODIUM (PORCINE) 5000 UNIT/ML IJ SOLN
5000.0000 [IU] | Freq: Three times a day (TID) | INTRAMUSCULAR | Status: DC
  Administered 2013-04-17 – 2013-04-22 (×16): 5000 [IU] via SUBCUTANEOUS
  Filled 2013-04-17 (×16): qty 1

## 2013-04-17 NOTE — Clinical Documentation Improvement (Signed)
Possible Clinical Conditions?   Chronic Systolic Congestive Heart Failure Chronic Diastolic Congestive Heart Failure Chronic Systolic & Diastolic Congestive Heart Failure Acute Systolic Congestive Heart Failure Acute Diastolic Congestive Heart Failure Acute Systolic & Diastolic Congestive Heart Failure Acute on Chronic Systolic Congestive Heart Failure Acute on Chronic Diastolic Congestive Heart Failure Acute on Chronic Systolic & Diastolic Congestive Heart Failure Other Condition Cannot Clinically Determine    Risk Factors: History of CHF per 4/14 progress notes.  Diagnostics: 4/14: proBNP: 8961.0   Thank You, Theron Arista, Clinical Documentation Specialist:  [place] [employer] [facility]

## 2013-04-17 NOTE — Clinical Social Work Note (Signed)
Consult received for Advanced Directives, CSW reviewed chart and noted patient is currently on vent and not appropriate for Adv Dir discussion.  Consult referred to Chaplain to complete when medically appropriate.  CSW signing off as consult has been given to [place].  Edwyna Shell, LCSW Clinical Social Worker [phone])

## 2013-04-17 NOTE — Consult Note (Signed)
Consult requested by: [facility] hospitalist Consult requested for respiratory failure:  HPI: This is a 63 year old who came to the emergency department with shortness of breath. He was found to have pneumonia and was started on treatment. However he deteriorated rapidly became hypotensive and developed acute respiratory failure requiring intubation and mechanical ventilation. He has been treated for sepsis but is still on pressors at this time. He remains intubated but is weaning now and actually looks pretty good but I don't think he can be extubated because he is still on pretty high dose pressors. In addition to the problems with his current illness he has a history of chronic kidney disease coronary artery occlusive disease COPD and diabetes.  Past Medical History  Diagnosis Date  . Hypertension   . Diabetes mellitus   . Colon polyp   . Obese   . PVC's (premature ventricular contractions)   . IBS (irritable bowel syndrome)   . Hyperlipidemia   . PONV (postoperative nausea and vomiting)   . Chronic kidney disease   . Coronary artery disease   . COPD (chronic obstructive pulmonary disease)      Family History  Problem Relation Age of Onset  . Hypertension Mother   . Emphysema Father   . Heart disease Sister     Enlarged heart     History   Social History  . Marital Status: Married    Spouse Name: N/A    Number of Children: N/A  . Years of Education: N/A   Occupational History  . Unemployed, laid off >10 years ago    Social History Main Topics  . Smoking status: Former Research scientist (life sciences)  . Smokeless tobacco: None     Comment: Smoked 20 years on and off, quit >17 years ago  . 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 Drug Use: No  . Sexual Activity: Not Currently   Other Topics Concern  . None   Social History Narrative   Walks and uses exercise bike     ROS: Not obtainable    Objective: Vital signs in last 24 hours: Temp:   [96.5 F (35.8 C)-101 F (38.3 C)] 101 F (38.3 C) (04/15 0732) Pulse Rate:  [39-106] 81 (04/15 0700) Resp:  [12-28] 18 (04/15 0700) BP: (67-152)/(28-82) 96/59 mmHg (04/15 0700) SpO2:  [88 %-100 %] 97 % (04/15 0727) FiO2 (%):  [35 %-40 %] 40 % (04/15 0729) Weight:  [115.5 kg (254 lb 10.1 oz)-115.6 kg (254 lb 13.6 oz)] 115.6 kg (254 lb 13.6 oz) (04/15 0500) Weight change:  Last BM Date: 04/15/13  Intake/Output from previous day: 04/14 0701 - 04/15 0700 In: 4641.4 [I.V.:2141.4; IV Piggyback:2500] Out: 760 [Urine:560; Emesis/NG output:200]  PHYSICAL EXAM He is awake and alert and motions he wants the tube out. His blood pressures running around 366 systolic on about 70 mics of Neo-Synephrine. His pupils are reactive nose and throat clear his mucous membranes are moist. His chest shows rhonchi bilaterally. His heart is regular without gallop. His abdomen is soft without masses he has trace to 1+ edema of both legs and has some erythema of both legs and his legs are slightly warm. Central nervous system examination is grossly intact  Lab Results: Basic Metabolic Panel:  Recent Labs  04/16/13 1227 04/17/13 0259  NA 135* 136*  K 4.4 4.3  CL 94* 98  CO2 26 26  GLUCOSE 70 56*  BUN 71* 77*  CREATININE 2.63*  2.80*  CALCIUM 9.0 7.9*   Liver Function Tests:  Recent Labs  04/17/13 0259  AST 40*  ALT 17  ALKPHOS 110  BILITOT 0.5  PROT 6.5  ALBUMIN 2.2*   No results found for this basename: LIPASE, AMYLASE,  in the last 72 hours No results found for this basename: AMMONIA,  in the last 72 hours CBC:  Recent Labs  04/16/13 1227 04/17/13 0259  WBC 12.2* 7.9  NEUTROABS 8.9*  --   HGB 12.2* 10.8*  HCT 39.8 35.9*  MCV 74.8* 75.7*  PLT 197 172   Cardiac Enzymes:  Recent Labs  04/16/13 1227 04/16/13 2049 04/17/13 0259  TROPONINI <0.30 <0.30 <0.30   BNP:  Recent Labs  04/16/13 1224  PROBNP 8961.0*   D-Dimer: No results found for this basename: DDIMER,  in the  last 72 hours CBG:  Recent Labs  04/16/13 1507 04/16/13 1825 04/16/13 2118 04/17/13 0717  GLUCAP 135* 110* 73 70   Hemoglobin A1C:  Recent Labs  04/16/13 1224  HGBA1C 8.2*   Fasting Lipid Panel: No results found for this basename: CHOL, HDL, LDLCALC, TRIG, CHOLHDL, LDLDIRECT,  in the last 72 hours Thyroid Function Tests:  Recent Labs  04/16/13 1227  TSH 2.880   Anemia Panel: No results found for this basename: VITAMINB12, FOLATE, FERRITIN, TIBC, IRON, RETICCTPCT,  in the last 72 hours Coagulation: No results found for this basename: LABPROT, INR,  in the last 72 hours Urine Drug Screen: Drugs of Abuse  No results found for this basename: labopia, cocainscrnur, labbenz, amphetmu, thcu, labbarb    Alcohol Level: No results found for this basename: ETH,  in the last 72 hours Urinalysis:  Recent Labs  04/16/13 1150 04/16/13 1808  COLORURINE BROWN* YELLOW  LABSPEC >1.030* >1.030*  PHURINE 5.0 5.0  GLUCOSEU NEGATIVE NEGATIVE  HGBUR SMALL* LARGE*  BILIRUBINUR NEGATIVE SMALL*  KETONESUR NEGATIVE NEGATIVE  PROTEINUR 100* 100*  UROBILINOGEN 1.0 1.0  NITRITE NEGATIVE NEGATIVE  LEUKOCYTESUR NEGATIVE NEGATIVE   Misc. Labs:   ABGS:  Recent Labs  04/16/13 1730  PHART 7.271*  PO2ART 132.0*  TCO2 20.6  HCO3 21.9     MICROBIOLOGY: Recent Results (from the past 240 hour(s))  MRSA PCR SCREENING     Status: None   Collection Time    04/16/13  4:20 PM      Result Value Ref Range Status   MRSA by PCR NEGATIVE  NEGATIVE Final   Comment:            The GeneXpert MRSA Assay (FDA     approved for NASAL specimens     only), is one component of a     comprehensive MRSA colonization     surveillance program. It is not     intended to diagnose MRSA     infection nor to guide or     monitor treatment for     MRSA infections.    Studies/Results: Dg Chest 2 View  04/16/2013   CLINICAL DATA:  Shortness of breath and chest pain  EXAM: CHEST  2 VIEW  COMPARISON:   Study obtained earlier in the day  FINDINGS: There is patchy consolidation in the right lower lobe. There is atelectatic change in the lingula. There is a small right pleural effusion. Elsewhere lungs are clear. Heart is upper normal in size with normal pulmonary vascularity. No adenopathy. Patient is status post coronary artery bypass grafting.  IMPRESSION: Patchy right lower lobe infiltrate. Lingular atelectatic change. Small right pleural effusion.  Electronically Signed   By: Lowella Grip M.D.   On: 04/16/2013 13:03   Portable Chest Xray In Am  04/17/2013   CLINICAL DATA:  Respiratory failure portable exam 0658 hr compared to 04/16/2013  EXAM: PORTABLE CHEST - 1 VIEW  COMPARISON:  None.  FINDINGS: Tip of endotracheal tube projects 5.1 cm above carinal.  Left subclavian central venous catheter tip projects over SVC.  Nasogastric tube seen into stomach.  Enlargement of cardiac silhouette with pulmonary vascular congestion.  Low lung volumes with bibasilar atelectasis.  Questionable perihilar and basilar infiltrates greater on right.  No pneumothorax.  IMPRESSION: Little interval change.   Electronically Signed   By: Lavonia Dana M.D.   On: 04/17/2013 07:58   Dg Chest Port 1v Same Day  04/16/2013   CLINICAL DATA:  Endotracheal tube and central line placement.  EXAM: PORTABLE CHEST - 1 VIEW SAME DAY  COMPARISON:  DG CHEST 2 VIEW dated 04/16/2013  FINDINGS: Endotracheal tube is in place with the tip approximately 3 cm above the carina. Left subclavian central line is been placed. The tip is near the confluence of the brachiocephalic veins and upper SVC. No pneumothorax is identified. Bilateral lower lobe atelectasis present, right greater than left. Mild edema present. Stable cardiomegaly.  IMPRESSION: Endotracheal tube tip approximately 3 cm above the carina. Central line tip is at the brachiocephalic venous confluence/ upper SVC. No pneumothorax.   Electronically Signed   By: Aletta Edouard M.D.   On:  04/16/2013 19:06   Dg Abd Acute W/chest  04/16/2013   CLINICAL DATA:  low back pain  Is  EXAM: ACUTE ABDOMEN SERIES (ABDOMEN 2 VIEW & CHEST 1 VIEW)  COMPARISON:  DG CHEST 1V PORT dated 12/11/2011  FINDINGS: There is no evidence of dilated bowel loops or free intraperitoneal air. No radiopaque calculi or other significant radiographic abnormality is seen.  Cardiac silhouette is enlarged, patient is status post median sternotomy. There is increased density within the lung bases, right greater than left with blunting of the right costophrenic angle.  IMPRESSION: Negative abdominal radiographs.  Atelectasis versus infiltrate within the lung bases right greater than left as well small right effusion. Further evaluation with dedicated two view chest radiograph recommended  Cardiomegaly   Electronically Signed   By: Margaree Mackintosh M.D.   On: 04/16/2013 11:10    Medications:  Prior to Admission:  Prescriptions prior to admission  Medication Sig Dispense Refill  . albuterol (PROVENTIL) (2.5 MG/3ML) 0.083% nebulizer solution Take 2.5 mg by nebulization every 6 (six) hours as needed for wheezing or shortness of breath.      . ALOE VERA PO Take 1 capsule by mouth 2 (two) times daily.      Marland Kitchen aspirin 325 MG EC tablet Take 325 mg by mouth at bedtime.       . benazepril-hydrochlorthiazide (LOTENSIN HCT) 10-12.5 MG per tablet Take 1 tablet by mouth daily.      Marland Kitchen esomeprazole (NEXIUM) 40 MG capsule Take 40 mg by mouth daily with supper. At 6pm      . lovastatin (MEVACOR) 20 MG tablet Take 20 mg by mouth at bedtime.      . Magnesium 500 MG TABS Take 500 mg by mouth 2 (two) times daily.      . metoprolol (LOPRESSOR) 100 MG tablet Take 100 mg by mouth 2 (two) times daily. **Take 175mg  twice a day takes with 50mg  for a total 175mg **      . metoprolol (LOPRESSOR) 50 MG  tablet Take 75 mg by mouth 2 (two) times daily. **Take 175mg  twice a day take with 100mg   For a total of 175mg **      . OVER THE COUNTER MEDICATION Take 1  tablet by mouth 3 (three) times daily with meals. "Dietary Supplement-Key 1-Raspberry flavored"      . polycarbophil (FIBERCON) 625 MG tablet Take 1,250 mg by mouth daily with supper. At 6pm      . repaglinide (PRANDIN) 2 MG tablet Take 4 mg by mouth 3 (three) times daily.      . sitaGLIPtan-metformin (JANUMET) 50-1000 MG per tablet Take 1 tablet by mouth 2 (two) times daily with a meal.        . dicyclomine (BENTYL) 20 MG tablet Take 20 mg by mouth 3 (three) times daily as needed for spasms. For stomach       Scheduled: . antiseptic oral rinse  15 mL Mouth Rinse q12n4p  . azithromycin  500 mg Intravenous Q24H  . cefTRIAXone (ROCEPHIN)  IV  1 g Intravenous Q24H  . chlorhexidine  15 mL Mouth Rinse BID  . chlorhexidine  15 mL Mouth Rinse BID  . enoxaparin (LOVENOX) injection  30 mg Subcutaneous Q24H  . insulin aspart  0-15 Units Subcutaneous TID WC  . insulin aspart  0-5 Units Subcutaneous QHS  . ipratropium-albuterol  3 mL Nebulization Q4H  . pantoprazole (PROTONIX) IV  40 mg Intravenous Daily  . sodium chloride  10-40 mL Intracatheter Q12H  . sodium chloride  3 mL Intravenous Q12H   Continuous: . dextrose 5 % and 0.45% NaCl 125 mL/hr at 04/17/13 0756  . phenylephrine (NEO-SYNEPHRINE) Adult infusion 65 mcg/min (04/17/13 0700)   QXI:[id number], acetaminophen, albuterol, fentaNYL, fentaNYL, ondansetron (ZOFRAN) IV, ondansetron, sodium chloride  Assesment: He was admitted with community-acquired pneumonia associated with severe sepsis and acute respiratory failure requiring intubation and mechanical ventilation. From a respiratory point of view he seems much better but he is still requiring pressors to maintain his blood pressure. Principal Problem:   Sepsis Active Problems:   DIABETES MELLITUS, WITH NEUROLOGICAL COMPLICATIONS   HYPERTENSION   Chest pain   CAD (coronary artery disease)   COPD (chronic obstructive pulmonary disease)   Chronic kidney disease   Hyperlipidemia    Obese   CAP (community acquired pneumonia)   Acute on chronic renal failure   Acute respiratory failure   Acute encephalopathy    Plan: Continue current medications. He can potentially be extubated if we can get him off of pressors.    LOS: 1 day   Alonza Bogus 04/17/2013, 8:41 AM

## 2013-04-17 NOTE — Progress Notes (Signed)
INITIAL NUTRITION ASSESSMENT  DOCUMENTATION CODES Per approved criteria  -Obesity Unspecified   INTERVENTION: If unable to extubate with 48 hours of intubation, recommend starting enteral nutrition support.  Recommend start Vital High Protein @ 20 ml/hr and advance 10 ml/hr every 4 hours to goal of 75 ml/hr. At goal rate, regimen will provide 1728 kcals, 158 grams protein, and 1505 ml fluid daily (which will meet 100% of estimated calorie and protein needs and 89% of estimated fluid needs). Also consider adding 50 ml flush every 6 hours, which will provide additional 200 ml fluid (total of 1705 ml fluid with feeding) to meet 100% of estimated fluid needs.   NUTRITION DIAGNOSIS: Inadequate oral intake related to inability to eat as evidenced by NPO, mechanical ventilation.   Goal: Enteral nutrition to provide 60-70% of estimated calorie needs (22-25 kcals/kg ideal body weight) and 100% of estimated protein needs, based on ASPEN guidelines for permissive underfeeding in critically ill obese individuals  Monitor:  Initiation of enteral feeding and tolerance, respiratory status, skin assessment, labs, weight changes, I/O's  Reason for Assessment: MST=2, mechanical ventilation  63 y.o. male  Admitting Dx: Sepsis  ASSESSMENT: Pt was admitted with sepsis. Per H&P, pt with decreased appetite and weakness for approximately one week. Wt hx reveals no significant weight changes for time period. Pt with hx of weight gain likely, due to edema, which was present upon admission.  Pt with SOB upon admission, which was first treated with Bipap (pt is supposed to use at home, however, does not secondary to financial restraints). He then became unresponsive and was intubated for airway protection and respiratory support.  Patient is currently intubated on ventilator support. Per RN, no plans to extubate today due to high dose pressors needed to sustain BP. RN also informed this RD that blood sugar was 70  this morning.  MV: 9.9 L/min Temp (24hrs), Avg:98.3 F (36.8 C), Min:96.5 F (35.8 C), Max:101 F (38.3 C)  Propofol: n/a ml/hr  Height: Ht Readings from Last 1 Encounters:  04/16/13 5\' 11"  (1.803 m)    Weight: Wt Readings from Last 1 Encounters:  04/17/13 254 lb 13.6 oz (115.6 kg)    Ideal Body Weight: 172#  % Ideal Body Weight: 148%  Wt Readings from Last 10 Encounters:  04/17/13 254 lb 13.6 oz (115.6 kg)  12/11/11 238 lb 0.1 oz (107.96 kg)  02/15/11 227 lb 4.8 oz (103.103 kg)  02/15/11 227 lb 4.8 oz (103.103 kg)  06/03/09 246 lb (111.585 kg)  11/10/08 236 lb (107.049 kg)  10/09/08 239 lb (108.41 kg)    Usual Body Weight: 240#  % Usual Body Weight: 106%  BMI:  Body mass index is 35.56 kg/(m^2). Meets criteria for obesity, class II.   Estimated Nutritional Needs: Kcal: 2403 daily Protein: 116-145 grams daily Fluid: 2.4-2.5 L daily  Skin: WDL  Diet Order: NPO  EDUCATION NEEDS: -Education not appropriate at this time   Intake/Output Summary (Last 24 hours) at 04/17/13 0938 Last data filed at 04/17/13 0700  Gross per 24 hour  Intake 4641.44 ml  Output    760 ml  Net 3881.44 ml    Last BM: 04/15/13  Labs:   Recent Labs Lab 04/16/13 1227 04/17/13 0259  NA 135* 136*  K 4.4 4.3  CL 94* 98  CO2 26 26  BUN 71* 77*  CREATININE 2.63* 2.80*  CALCIUM 9.0 7.9*  GLUCOSE 70 56*    CBG (last 3)   Recent Labs  04/16/13 1825 04/16/13  2118 04/17/13 0717  GLUCAP 110* 73 70    Scheduled Meds: . antiseptic oral rinse  15 mL Mouth Rinse q12n4p  . azithromycin  500 mg Intravenous Q24H  . cefTRIAXone (ROCEPHIN)  IV  1 g Intravenous Q24H  . chlorhexidine  15 mL Mouth Rinse BID  . chlorhexidine  15 mL Mouth Rinse BID  . heparin subcutaneous  5,000 Units Subcutaneous 3 times per day  . insulin aspart  0-15 Units Subcutaneous Q6H  . ipratropium-albuterol  3 mL Nebulization Q4H  . pantoprazole (PROTONIX) IV  40 mg Intravenous Daily  . sodium  chloride  10-40 mL Intracatheter Q12H  . sodium chloride  3 mL Intravenous Q12H    Continuous Infusions: . dextrose 5 % and 0.45% NaCl 125 mL/hr at 04/17/13 0756  . phenylephrine (NEO-SYNEPHRINE) Adult infusion 55 mcg/min (04/17/13 0800)    Past Medical History  Diagnosis Date  . Hypertension   . Diabetes mellitus   . Colon polyp   . Obese   . PVC's (premature ventricular contractions)   . IBS (irritable bowel syndrome)   . Hyperlipidemia   . PONV (postoperative nausea and vomiting)   . Chronic kidney disease   . Coronary artery disease   . COPD (chronic obstructive pulmonary disease)     Past Surgical History  Procedure Laterality Date  . Cardiac catheterization    . Cystectomy      Scrotal abscess  . Coronary artery bypass graft    . Cholecystectomy  02/14/2011    Procedure: LAPAROSCOPIC CHOLECYSTECTOMY;  Surgeon: Donato Heinz, MD;  Location: [facility];  Service: General;  Laterality: N/A;   Teaghan Melrose A. Jimmye Norman, RD, LDN Pager: [phone]

## 2013-04-17 NOTE — Progress Notes (Signed)
  Echocardiogram 2D Echocardiogram has been performed.  Jerry Williams 04/17/2013, 3:24 PM

## 2013-04-17 NOTE — Progress Notes (Signed)
ANTIBIOTIC CONSULT NOTE  Pharmacy Consult for Vancomycin and Rocephin Indication: pneumonia  No Known Allergies  Patient Measurements: Height: 5\' 11"  (180.3 cm) Weight: 254 lb 13.6 oz (115.6 kg) IBW/kg (Calculated) : 75.3  Vital Signs: Temp: 99.2 F (37.3 C) (04/15 1130) Temp src: Axillary (04/15 1130) BP: 107/44 mmHg (04/15 1200) Pulse Rate: 89 (04/15 1200) Intake/Output from previous day: 04/14 0701 - 04/15 0700 In: 4641.4 [I.V.:2141.4; IV Piggyback:2500] Out: 760 [Urine:560; Emesis/NG output:200] Intake/Output from this shift: Total I/O In: 558.3 [I.V.:508.3; IV Piggyback:50] Out: 350 [Urine:350]  Labs:  Recent Labs  04/16/13 1227 04/17/13 0259  WBC 12.2* 7.9  HGB 12.2* 10.8*  PLT 197 172  CREATININE 2.63* 2.80*   Estimated Creatinine Clearance: 36.3 ml/min (by C-G formula based on Cr of 2.8). No results found for this basename: VANCOTROUGH, Corlis Leak, VANCORANDOM, [place], GENTPEAK, GENTRANDOM, TOBRATROUGH, TOBRAPEAK, TOBRARND, AMIKACINPEAK, AMIKACINTROU, AMIKACIN,  in the last 72 hours   Microbiology: Recent Results (from the past 720 hour(s))  MRSA PCR SCREENING     Status: None   Collection Time    04/16/13  4:20 PM      Result Value Ref Range Status   MRSA by PCR NEGATIVE  NEGATIVE Final   Comment:            The GeneXpert MRSA Assay (FDA     approved for NASAL specimens     only), is one component of a     comprehensive MRSA colonization     surveillance program. It is not     intended to diagnose MRSA     infection nor to guide or     monitor treatment for     MRSA infections.  CULTURE, BLOOD (ROUTINE X 2)     Status: None   Collection Time    04/17/13  7:53 AM      Result Value Ref Range Status   Specimen Description BLOOD RIGHT ARM   Final   Special Requests BOTTLES DRAWN AEROBIC AND ANAEROBIC 8CC   Final   Culture NO GROWTH <24 HRS   Final   Report Status PENDING   Incomplete  CULTURE, BLOOD (ROUTINE X 2)     Status: None   Collection  Time    04/17/13  7:53 AM      Result Value Ref Range Status   Specimen Description BLOOD LEFT ARM   Final   Special Requests BOTTLES DRAWN AEROBIC AND ANAEROBIC 8CC   Final   Culture NO GROWTH <24 HRS   Final   Report Status PENDING   Incomplete    Medical History: Past Medical History  Diagnosis Date  . Hypertension   . Diabetes mellitus   . Colon polyp   . Obese   . PVC's (premature ventricular contractions)   . IBS (irritable bowel syndrome)   . Hyperlipidemia   . PONV (postoperative nausea and vomiting)   . Chronic kidney disease   . Coronary artery disease   . COPD (chronic obstructive pulmonary disease)     Medications:  Scheduled:  . antiseptic oral rinse  15 mL Mouth Rinse q12n4p  . azithromycin  500 mg Intravenous Q24H  . cefTRIAXone (ROCEPHIN)  IV  1 g Intravenous Q24H  . chlorhexidine  15 mL Mouth Rinse BID  . chlorhexidine  15 mL Mouth Rinse BID  . heparin subcutaneous  5,000 Units Subcutaneous 3 times per day  . insulin aspart  0-15 Units Subcutaneous Q6H  . ipratropium-albuterol  3 mL Nebulization Q4H  .  pantoprazole (PROTONIX) IV  40 mg Intravenous Daily  . sodium chloride  10-40 mL Intracatheter Q12H  . sodium chloride  3 mL Intravenous Q12H   Assessment: 63 yo male admitted with CAP, sepsis.  He remains intubated on pressor support & continues to be febrile.  WBC has normalized & he is clinically improving per MD notes.   He has CKD.  Normalized CrCl ~ 25-21ml/min.     Zithromax 4/14 >> Rocephin 4/14 >> Vancomycin 4/14 >>  Goal of Therapy:  Vancomycin trough level 15-20 mcg/ml Eradicate infection.  Plan:  Vancomycin 1500mg  IV q48h Check Vancomycin trough at steady state Continue Rocephin & Zithromax Monitor labs, renal fxn, and cultures  Jerry Williams 04/17/2013,1:56 PM

## 2013-04-17 NOTE — Progress Notes (Signed)
UR chart review completed.  

## 2013-04-17 NOTE — Progress Notes (Signed)
PROGRESS NOTE  Jerry Williams HBZ:[id number] DOB: 12-07-1950 DOA: 04/16/2013 PCP: No primary provider on file. [facility] Department?  Summary: 63 year old man with history of CABG, diabetes, COPD presented to the emergency department 4/14 with worsening shortness of breath, generalized weakness, intermittent chest pain. Admitted for sepsis with acute respiratory failure, hypotension thought related to pneumonia.  Assessment/Plan: 1. Sepsis with acute hypoxic respiratory failure requiring intubation and mechanical ventilation, acute renal failure, hypotension, encephalopathy, respiratory acidosis. Clinically improved although still requiring vasopressor support. Hopeful to wean off ventilator today. 2. Acute hypoxic respiratory failure with acute respiratory acidosis. As above. Currently appears to be tolerating weaning trial. FiO2 40% with good oxygenation. 3. CAP. Clinically appears improved although still febrile. 4. Acute renal failure likely ATN from sepsis complicated by ACE inhibitor, hydrochlorothiazide. Somewhat worse today. Follow clinically. 5. Acute encephalopathy secondary to acute respiratory acidosis. Resolved. 6. Anemia of critical illness. Follow clinically. No evidence of bleeding. 7. Chest pain intermittent. Currently pain free. Troponins negative. Followup echocardiogram. 8. Right-sided flank pain. Currently resolved. Denies back pain. 9. LE edema. Etiology unclear. Consider RV failure, PHTN, OSA. 10. UTI. Urinalysis equivocal. Urine culture pending. 11.  chronic diastolic dysfunction.  12. CAD, CABG 2012 13. DM. Hemoglobin A1c 8.2. TSH normal. CABG stable.  14. COPD, stopped smoking 1989. 15. OSA, BiPAP recommended in past by report, not on because of finances.    Appreciate pulmonary consultation, vent management. Hopefully can be weaned in the next 24 hours.  Blood pressure has improved. Given clinical improvement, wean phenylephrine. If hypotension recurs,  consider changing to Levophed.  Given clinical improvement, continue current antibiotics. Leukocytosis has resolved.  CBC, CMP in the morning  Followup 2-D echocardiogram  Check renal ultrasound. Foley urine output closely.  Sliding-scale insulin.  Change low-dose Lovenox to heparin  Updated wife at bedside with current diagnoses, laboratory studies and treatment plan.  Pending studies:   Urine culture.  Blood cultures.  Code Status: full code DVT prophylaxis: heparin Family Communication:  Disposition Plan: home when improved  Murray Hodgkins, MD  [facility]  Pager [phone] If 7PM-7AM, please contact night-coverage at [email or web address], password [facility] 04/17/2013, 8:48 AM  LOS: 1 day   Consultants:  Pulmonology  Procedures:  Intubation 4/14 >>   Central line 4/14 >>   Antibiotics:  Zithromax 4/14 >>   Ceftriaxone 4/14 >>   HPI/Subjective: Febrile overnight. Currently undergoing weaning trial. Patient awake, calm. Denies pain. Wife reports he had generalized weakness at home, fever, sweats as well as right-sided flank pain consistent with history of nephrolithiasis. Lower extremity edema has much improved.  Objective: Filed Vitals:   04/17/13 0700 04/17/13 0727 04/17/13 0732 04/17/13 0800  BP: 96/59   104/57  Pulse: 81   85  Temp:   101 F (38.3 C)   TempSrc:   Axillary   Resp: 18   25  Height:      Weight:      SpO2: 96% 97%  97%    Intake/Output Summary (Last 24 hours) at 04/17/13 0848 Last data filed at 04/17/13 0700  Gross per 24 hour  Intake 4641.44 ml  Output    760 ml  Net 3881.44 ml     Filed Weights   04/16/13 1600 04/17/13 0500  Weight: 115.5 kg (254 lb 10.1 oz) 115.6 kg (254 lb 13.6 oz)    Exam:   Febrile 101. SPEP 90-100s. Oxygen 97% on FiO2 40%.  Gen. Appears calm, comfortable, awake and alert on ventilator. Follows commands appropriately.  Eyes. Pupils equal, round, reactive to light. Normal lids, irises.  ENT.  Intubated. Lips appear unremarkable.  Neck. Appears grossly unremarkable.  Cardiovascular. Regular rate and rhythm. No murmur, rub or gallop. 1+ bilateral lower extremity edema. Telemetry sinus rhythm. 2+ dorsalis pedis pulses bilaterally.  Respiratory clear to auscultation bilaterally. No wheezes, rales or rhonchi. Normal respiratory effort.  Abdomen soft, obese, nontender. No masses appreciated.  GU. Penis, scrotum appears grossly unremarkable. No obvious hernia seen.  Skin. Evidence of some chronic venous stasis changes bilateral lower extremities.  Psychiatric. Follows commands as above.  Musculoskeletal. Moves all extremities to command. Excellent bilateral grip strength. Lives both legs off the bed easily without apparent pain.  Data Reviewed:  +3.8 L since admission. Urine output 760.  Capillary blood sugars stable.  BUN, creatinine modestly worse. 77/2.8.  troponins negative.  Abdominal x-ray negative.  Chest x-ray with patchy right lower lobe infiltrate. EKG was sinus rhythm, LVH with repolarization abnormality consistent with previous EKGs. Modest elevation of QRS, possible left bundle branch block but in comparison to previous EKGs suspect LVH with repolarization abnormality.  Scheduled Meds: . antiseptic oral rinse  15 mL Mouth Rinse q12n4p  . azithromycin  500 mg Intravenous Q24H  . cefTRIAXone (ROCEPHIN)  IV  1 g Intravenous Q24H  . chlorhexidine  15 mL Mouth Rinse BID  . chlorhexidine  15 mL Mouth Rinse BID  . enoxaparin (LOVENOX) injection  30 mg Subcutaneous Q24H  . insulin aspart  0-15 Units Subcutaneous TID WC  . insulin aspart  0-5 Units Subcutaneous QHS  . ipratropium-albuterol  3 mL Nebulization Q4H  . pantoprazole (PROTONIX) IV  40 mg Intravenous Daily  . sodium chloride  10-40 mL Intracatheter Q12H  . sodium chloride  3 mL Intravenous Q12H   Continuous Infusions: . dextrose 5 % and 0.45% NaCl 125 mL/hr at 04/17/13 0756  . phenylephrine  (NEO-SYNEPHRINE) Adult infusion 55 mcg/min (04/17/13 0800)    Principal Problem:   Sepsis Active Problems:   DIABETES MELLITUS, WITH NEUROLOGICAL COMPLICATIONS   HYPERTENSION   Chest pain   CAD (coronary artery disease)   COPD (chronic obstructive pulmonary disease)   Chronic kidney disease   Hyperlipidemia   Obese   CAP (community acquired pneumonia)   Acute on chronic renal failure   Acute respiratory failure   Acute encephalopathy   Time spent 40 minutes

## 2013-04-17 NOTE — Progress Notes (Signed)
Placed Pt on CPAP n PS at 729 The Pt di great. I wasn't able to wean to extubate due to the pressors the Pt was on for his BP. I placed Pt back on full support at 1120.

## 2013-04-18 ENCOUNTER — Inpatient Hospital Stay (HOSPITAL_COMMUNITY): Payer: BC Managed Care – PPO

## 2013-04-18 LAB — GLUCOSE, CAPILLARY
GLUCOSE-CAPILLARY: 193 mg/dL — AB (ref 70–99)
Glucose-Capillary: 177 mg/dL — ABNORMAL HIGH (ref 70–99)
Glucose-Capillary: 199 mg/dL — ABNORMAL HIGH (ref 70–99)
Glucose-Capillary: 211 mg/dL — ABNORMAL HIGH (ref 70–99)
Glucose-Capillary: 218 mg/dL — ABNORMAL HIGH (ref 70–99)

## 2013-04-18 LAB — LEGIONELLA ANTIGEN, URINE: Legionella Antigen, Urine: NEGATIVE

## 2013-04-18 LAB — URINE CULTURE

## 2013-04-18 LAB — COMPREHENSIVE METABOLIC PANEL
ALK PHOS: 123 U/L — AB (ref 39–117)
ALT: 16 U/L (ref 0–53)
AST: 31 U/L (ref 0–37)
Albumin: 2.3 g/dL — ABNORMAL LOW (ref 3.5–5.2)
BUN: 40 mg/dL — ABNORMAL HIGH (ref 6–23)
CO2: 26 mEq/L (ref 19–32)
Calcium: 8.1 mg/dL — ABNORMAL LOW (ref 8.4–10.5)
Chloride: 96 mEq/L (ref 96–112)
Creatinine, Ser: 1.43 mg/dL — ABNORMAL HIGH (ref 0.50–1.35)
GFR calc non Af Amer: 52 mL/min — ABNORMAL LOW (ref >90)
GFR, EST AFRICAN AMERICAN: 60 mL/min — AB (ref >90)
Glucose, Bld: 220 mg/dL — ABNORMAL HIGH (ref 70–99)
POTASSIUM: 4.1 meq/L (ref 3.7–5.3)
SODIUM: 136 meq/L — AB (ref 137–147)
TOTAL PROTEIN: 7 g/dL (ref 6.0–8.3)
Total Bilirubin: 0.7 mg/dL (ref 0.3–1.2)

## 2013-04-18 LAB — CBC
HEMATOCRIT: 39.1 % (ref 39.0–52.0)
Hemoglobin: 11.6 g/dL — ABNORMAL LOW (ref 13.0–17.0)
MCH: 22.6 pg — AB (ref 26.0–34.0)
MCHC: 29.7 g/dL — AB (ref 30.0–36.0)
MCV: 76.1 fL — ABNORMAL LOW (ref 78.0–100.0)
Platelets: 176 10*3/uL (ref 150–400)
RBC: 5.14 MIL/uL (ref 4.22–5.81)
RDW: 16.7 % — AB (ref 11.5–15.5)
WBC: 8 10*3/uL (ref 4.0–10.5)

## 2013-04-18 MED ORDER — VANCOMYCIN HCL IN DEXTROSE 1-5 GM/200ML-% IV SOLN
1000.0000 mg | Freq: Two times a day (BID) | INTRAVENOUS | Status: AC
  Administered 2013-04-18 – 2013-04-21 (×7): 1000 mg via INTRAVENOUS
  Filled 2013-04-18 (×9): qty 200

## 2013-04-18 MED ORDER — PHENYLEPHRINE HCL 10 MG/ML IJ SOLN
INTRAMUSCULAR | Status: AC
  Filled 2013-04-18: qty 2

## 2013-04-18 MED ORDER — IPRATROPIUM-ALBUTEROL 0.5-2.5 (3) MG/3ML IN SOLN
3.0000 mL | Freq: Four times a day (QID) | RESPIRATORY_TRACT | Status: DC
  Administered 2013-04-18 – 2013-04-22 (×15): 3 mL via RESPIRATORY_TRACT
  Filled 2013-04-18 (×15): qty 3

## 2013-04-18 MED ORDER — SODIUM CHLORIDE 0.45 % IV SOLN
INTRAVENOUS | Status: DC
  Administered 2013-04-18 – 2013-04-19 (×2): via INTRAVENOUS

## 2013-04-18 NOTE — Progress Notes (Signed)
Subjective: He remains intubated and on mechanical ventilation and he is still requiring pressor support for his blood pressure.  Objective: Vital signs in last 24 hours: Temp:  [98.2 F (36.8 C)-101.8 F (38.8 C)] 99.7 F (37.6 C) (04/16 0400) Pulse Rate:  [25-159] 121 (04/16 0630) Resp:  [15-32] 26 (04/16 0630) BP: (88-176)/(39-75) 116/63 mmHg (04/16 0630) SpO2:  [9 %-100 %] 96 % (04/16 0630) FiO2 (%):  [40 %] 40 % (04/16 0456) Weight:  [120 kg (264 lb 8.8 oz)] 120 kg (264 lb 8.8 oz) (04/16 0500) Weight change: 4.5 kg (9 lb 14.7 oz) Last BM Date: 04/15/13  Intake/Output from previous day: 04/15 0701 - 04/16 0700 In: 4099.8 [I.V.:3799.8; IV Piggyback:300] Out: 3325 [Urine:3025; Emesis/NG output:300]  PHYSICAL EXAM General appearance: alert, cooperative and mild distress Resp: rhonchi bilaterally Cardio: regular rate and rhythm, S1, S2 normal, no murmur, click, rub or gallop GI: soft, non-tender; bowel sounds normal; no masses,  no organomegaly Extremities: Still some erythema of both legs  Lab Results:    Basic Metabolic Panel:  Recent Labs  04/17/13 0259 04/18/13 0505  NA 136* 136*  K 4.3 4.1  CL 98 96  CO2 26 26  GLUCOSE 56* 220*  BUN 77* 40*  CREATININE 2.80* 1.43*  CALCIUM 7.9* 8.1*   Liver Function Tests:  Recent Labs  04/17/13 0259 04/18/13 0505  AST 40* 31  ALT 17 16  ALKPHOS 110 123*  BILITOT 0.5 0.7  PROT 6.5 7.0  ALBUMIN 2.2* 2.3*   No results found for this basename: LIPASE, AMYLASE,  in the last 72 hours No results found for this basename: AMMONIA,  in the last 72 hours CBC:  Recent Labs  04/16/13 1227 04/17/13 0259 04/18/13 0505  WBC 12.2* 7.9 8.0  NEUTROABS 8.9*  --   --   HGB 12.2* 10.8* 11.6*  HCT 39.8 35.9* 39.1  MCV 74.8* 75.7* 76.1*  PLT 197 172 176   Cardiac Enzymes:  Recent Labs  04/16/13 1227 04/16/13 2049 04/17/13 0259  TROPONINI <0.30 <0.30 <0.30   BNP:  Recent Labs  04/16/13 1224  PROBNP 8961.0*    D-Dimer: No results found for this basename: DDIMER,  in the last 72 hours CBG:  Recent Labs  04/16/13 2118 04/17/13 0717 04/17/13 1122 04/17/13 1742 04/18/13 0033 04/18/13 0546  GLUCAP 73 70 123* 166* 177* 211*   Hemoglobin A1C:  Recent Labs  04/16/13 1224  HGBA1C 8.2*   Fasting Lipid Panel: No results found for this basename: CHOL, HDL, LDLCALC, TRIG, CHOLHDL, LDLDIRECT,  in the last 72 hours Thyroid Function Tests:  Recent Labs  04/16/13 1227  TSH 2.880   Anemia Panel: No results found for this basename: VITAMINB12, FOLATE, FERRITIN, TIBC, IRON, RETICCTPCT,  in the last 72 hours Coagulation: No results found for this basename: LABPROT, INR,  in the last 72 hours Urine Drug Screen: Drugs of Abuse  No results found for this basename: labopia, cocainscrnur, labbenz, amphetmu, thcu, labbarb    Alcohol Level: No results found for this basename: ETH,  in the last 72 hours Urinalysis:  Recent Labs  04/16/13 1150 04/16/13 1808  COLORURINE BROWN* YELLOW  LABSPEC >1.030* >1.030*  PHURINE 5.0 5.0  GLUCOSEU NEGATIVE NEGATIVE  HGBUR SMALL* LARGE*  BILIRUBINUR NEGATIVE SMALL*  KETONESUR NEGATIVE NEGATIVE  PROTEINUR 100* 100*  UROBILINOGEN 1.0 1.0  NITRITE NEGATIVE NEGATIVE  LEUKOCYTESUR NEGATIVE NEGATIVE   Misc. Labs:  ABGS  Recent Labs  04/16/13 1730  PHART 7.271*  PO2ART 132.0*  TCO2  20.6  HCO3 21.9   CULTURES Recent Results (from the past 240 hour(s))  URINE CULTURE     Status: None   Collection Time    04/16/13 12:40 PM      Result Value Ref Range Status   Specimen Description URINE, CLEAN CATCH   Final   Special Requests NONE   Final   Culture  Setup Time     Final   Value: 04/16/2013 22:55     Performed at [facility]     Final   Value: 20,OOO COLONIES/ML     Performed at [facility]   Culture     Final   Value: ESCHERICHIA COLI     Performed at [facility]   Report Status PENDING    Incomplete  MRSA PCR SCREENING     Status: None   Collection Time    04/16/13  4:20 PM      Result Value Ref Range Status   MRSA by PCR NEGATIVE  NEGATIVE Final   Comment:            The GeneXpert MRSA Assay (FDA     approved for NASAL specimens     only), is one component of a     comprehensive MRSA colonization     surveillance program. It is not     intended to diagnose MRSA     infection nor to guide or     monitor treatment for     MRSA infections.  CULTURE, BLOOD (ROUTINE X 2)     Status: None   Collection Time    04/17/13  7:53 AM      Result Value Ref Range Status   Specimen Description BLOOD RIGHT ARM   Final   Special Requests BOTTLES DRAWN AEROBIC AND ANAEROBIC 8CC   Final   Culture NO GROWTH <24 HRS   Final   Report Status PENDING   Incomplete  CULTURE, BLOOD (ROUTINE X 2)     Status: None   Collection Time    04/17/13  7:53 AM      Result Value Ref Range Status   Specimen Description BLOOD LEFT ARM   Final   Special Requests BOTTLES DRAWN AEROBIC AND ANAEROBIC 8CC   Final   Culture NO GROWTH <24 HRS   Final   Report Status PENDING   Incomplete   Studies/Results: Dg Chest 2 View  04/16/2013   CLINICAL DATA:  Shortness of breath and chest pain  EXAM: CHEST  2 VIEW  COMPARISON:  Study obtained earlier in the day  FINDINGS: There is patchy consolidation in the right lower lobe. There is atelectatic change in the lingula. There is a small right pleural effusion. Elsewhere lungs are clear. Heart is upper normal in size with normal pulmonary vascularity. No adenopathy. Patient is status post coronary artery bypass grafting.  IMPRESSION: Patchy right lower lobe infiltrate. Lingular atelectatic change. Small right pleural effusion.   Electronically Signed   By: Lowella Grip M.D.   On: 04/16/2013 13:03   US Renal  04/17/2013   CLINICAL DATA:  acute renal failure  EXAM: RENAL/URINARY TRACT ULTRASOUND COMPLETE  COMPARISON:  DG ABD ACUTE W/CHEST dated 04/16/2013; CT  ABD/PELVIS W CM dated 02/11/2011  FINDINGS: Right Kidney:  Length: 15.9 cm. Echogenicity within normal limits. Multiple echogenic foci in the medullary portion of the kidney with acoustic shadowing. The largest area measures 4.3 cm in longitudinal dimensions. A sinus consistent with nonobstructing  medullary calculi. There is evidence hydronephrosis nor masses.  Left Kidney:  Length: 16 cm. Echogenicity within normal limits. Small punctate echogenic foci is appreciated within the medullary portion of the kidney consistent with nonobstructing calculi. There is no evidence of hydronephrosis nor masses.  Bladder:  Decompressed secondary to Foley catheter placement.  IMPRESSION: Bilateral nonobstructing medullary calculi largest on the right. No further sonographic abnormalities.   Electronically Signed   By: Margaree Mackintosh M.D.   On: 04/17/2013 13:44   Portable Chest Xray In Am  04/17/2013   CLINICAL DATA:  Respiratory failure portable exam 0658 hr compared to 04/16/2013  EXAM: PORTABLE CHEST - 1 VIEW  COMPARISON:  None.  FINDINGS: Tip of endotracheal tube projects 5.1 cm above carinal.  Left subclavian central venous catheter tip projects over SVC.  Nasogastric tube seen into stomach.  Enlargement of cardiac silhouette with pulmonary vascular congestion.  Low lung volumes with bibasilar atelectasis.  Questionable perihilar and basilar infiltrates greater on right.  No pneumothorax.  IMPRESSION: Little interval change.   Electronically Signed   By: Lavonia Dana M.D.   On: 04/17/2013 07:58   Dg Chest Port 1v Same Day  04/16/2013   CLINICAL DATA:  Endotracheal tube and central line placement.  EXAM: PORTABLE CHEST - 1 VIEW SAME DAY  COMPARISON:  DG CHEST 2 VIEW dated 04/16/2013  FINDINGS: Endotracheal tube is in place with the tip approximately 3 cm above the carina. Left subclavian central line is been placed. The tip is near the confluence of the brachiocephalic veins and upper SVC. No pneumothorax is identified.  Bilateral lower lobe atelectasis present, right greater than left. Mild edema present. Stable cardiomegaly.  IMPRESSION: Endotracheal tube tip approximately 3 cm above the carina. Central line tip is at the brachiocephalic venous confluence/ upper SVC. No pneumothorax.   Electronically Signed   By: Aletta Edouard M.D.   On: 04/16/2013 19:06   Dg Abd Acute W/chest  04/16/2013   CLINICAL DATA:  low back pain  Is  EXAM: ACUTE ABDOMEN SERIES (ABDOMEN 2 VIEW & CHEST 1 VIEW)  COMPARISON:  DG CHEST 1V PORT dated 12/11/2011  FINDINGS: There is no evidence of dilated bowel loops or free intraperitoneal air. No radiopaque calculi or other significant radiographic abnormality is seen.  Cardiac silhouette is enlarged, patient is status post median sternotomy. There is increased density within the lung bases, right greater than left with blunting of the right costophrenic angle.  IMPRESSION: Negative abdominal radiographs.  Atelectasis versus infiltrate within the lung bases right greater than left as well small right effusion. Further evaluation with dedicated two view chest radiograph recommended  Cardiomegaly   Electronically Signed   By: Margaree Mackintosh M.D.   On: 04/16/2013 11:10    Medications:  Prior to Admission:  Prescriptions prior to admission  Medication Sig Dispense Refill  . albuterol (PROVENTIL) (2.5 MG/3ML) 0.083% nebulizer solution Take 2.5 mg by nebulization every 6 (six) hours as needed for wheezing or shortness of breath.      . ALOE VERA PO Take 1 capsule by mouth 2 (two) times daily.      Marland Kitchen aspirin 325 MG EC tablet Take 325 mg by mouth at bedtime.       . benazepril-hydrochlorthiazide (LOTENSIN HCT) 10-12.5 MG per tablet Take 1 tablet by mouth daily.      Marland Kitchen esomeprazole (NEXIUM) 40 MG capsule Take 40 mg by mouth daily with supper. At 6pm      . lovastatin (MEVACOR) 20 MG  tablet Take 20 mg by mouth at bedtime.      . Magnesium 500 MG TABS Take 500 mg by mouth 2 (two) times daily.      .  metoprolol (LOPRESSOR) 100 MG tablet Take 100 mg by mouth 2 (two) times daily. **Take 175mg  twice a day takes with 50mg  for a total 175mg **      . metoprolol (LOPRESSOR) 50 MG tablet Take 75 mg by mouth 2 (two) times daily. **Take 175mg  twice a day take with 100mg   For a total of 175mg **      . OVER THE COUNTER MEDICATION Take 1 tablet by mouth 3 (three) times daily with meals. "Dietary Supplement-Key 1-Raspberry flavored"      . polycarbophil (FIBERCON) 625 MG tablet Take 1,250 mg by mouth daily with supper. At 6pm      . repaglinide (PRANDIN) 2 MG tablet Take 4 mg by mouth 3 (three) times daily.      . sitaGLIPtan-metformin (JANUMET) 50-1000 MG per tablet Take 1 tablet by mouth 2 (two) times daily with a meal.        . dicyclomine (BENTYL) 20 MG tablet Take 20 mg by mouth 3 (three) times daily as needed for spasms. For stomach       Scheduled: . antiseptic oral rinse  15 mL Mouth Rinse q12n4p  . azithromycin  500 mg Intravenous Q24H  . cefTRIAXone (ROCEPHIN)  IV  1 g Intravenous Q24H  . chlorhexidine  15 mL Mouth Rinse BID  . heparin subcutaneous  5,000 Units Subcutaneous 3 times per day  . insulin aspart  0-15 Units Subcutaneous Q6H  . ipratropium-albuterol  3 mL Nebulization Q4H  . pantoprazole (PROTONIX) IV  40 mg Intravenous Daily  . sodium chloride  10-40 mL Intracatheter Q12H  . sodium chloride  3 mL Intravenous Q12H  . vancomycin  1,500 mg Intravenous Q48H   Continuous: . dextrose 5 % and 0.45% NaCl 125 mL/hr at 04/18/13 0218  . phenylephrine (NEO-SYNEPHRINE) Adult infusion 40 mcg/min (04/18/13 0530)   [id number], acetaminophen, albuterol, fentaNYL, fentaNYL, ondansetron (ZOFRAN) IV, ondansetron, sodium chloride  Assesment: He was admitted with acute respiratory failure from community acquired pneumonia and COPD. He was septic on admission. He initially did not require ventilator support but he deteriorated rapidly after admission. He is still having problems holding his  blood pressure without pressor support. He is doing exceptionally well from a respiratory point of view it would be able to be extubated except for the problems with blood pressure and continue pressor support Principal Problem:   Sepsis Active Problems:   DIABETES MELLITUS, WITH NEUROLOGICAL COMPLICATIONS   HYPERTENSION   Chest pain   CAD (coronary artery disease)   COPD (chronic obstructive pulmonary disease)   Chronic kidney disease   Hyperlipidemia   Obese   CAP (community acquired pneumonia)   Acute on chronic renal failure   Acute respiratory failure   Acute encephalopathy    Plan: Continue current treatments see if we can get him off of pressors and if so I think he can come off of the ventilator    LOS: 2 days   Jerry Williams 04/18/2013, 7:55 AM

## 2013-04-18 NOTE — Progress Notes (Signed)
ANTIBIOTIC CONSULT NOTE  Pharmacy Consult for Vancomycin and Rocephin Indication: pneumonia  No Known Allergies  Patient Measurements: Height: 5\' 11"  (180.3 cm) Weight: 264 lb 8.8 oz (120 kg) IBW/kg (Calculated) : 75.3  Vital Signs: Temp: 98.6 F (37 C) (04/16 1130) Temp src: Axillary (04/16 1130) BP: 125/54 mmHg (04/16 1100) Pulse Rate: 109 (04/16 1100) Intake/Output from previous day: 04/15 0701 - 04/16 0700 In: 4099.8 [I.V.:3799.8; IV Piggyback:300] Out: 3325 [Urine:3025; Emesis/NG output:300] Intake/Output from this shift:    Labs:  Recent Labs  04/16/13 1227 04/17/13 0259 04/18/13 0505  WBC 12.2* 7.9 8.0  HGB 12.2* 10.8* 11.6*  PLT 197 172 176  CREATININE 2.63* 2.80* 1.43*   Estimated Creatinine Clearance: 72.4 ml/min (by C-G formula based on Cr of 1.43). No results found for this basename: Letta Median, VANCORANDOM, GENTTROUGH, GENTPEAK, GENTRANDOM, TOBRATROUGH, TOBRAPEAK, TOBRARND, AMIKACINPEAK, AMIKACINTROU, AMIKACIN,  in the last 72 hours   Microbiology: Recent Results (from the past 720 hour(s))  URINE CULTURE     Status: None   Collection Time    04/16/13 12:40 PM      Result Value Ref Range Status   Specimen Description URINE, CLEAN CATCH   Final   Special Requests NONE   Final   Culture  Setup Time     Final   Value: 04/16/2013 22:55     Performed at [facility]     Final   Value: 20,OOO COLONIES/ML     Performed at [facility]   Culture     Final   Value: ESCHERICHIA COLI     Performed at [facility]   Report Status PENDING   Incomplete  MRSA PCR SCREENING     Status: None   Collection Time    04/16/13  4:20 PM      Result Value Ref Range Status   MRSA by PCR NEGATIVE  NEGATIVE Final   Comment:            The GeneXpert MRSA Assay (FDA     approved for NASAL specimens     only), is one component of a     comprehensive MRSA colonization     surveillance program. It is not     intended  to diagnose MRSA     infection nor to guide or     monitor treatment for     MRSA infections.  CULTURE, BLOOD (ROUTINE X 2)     Status: None   Collection Time    04/17/13  7:53 AM      Result Value Ref Range Status   Specimen Description BLOOD RIGHT ARM   Final   Special Requests BOTTLES DRAWN AEROBIC AND ANAEROBIC 8CC   Final   Culture NO GROWTH 1 DAY   Final   Report Status PENDING   Incomplete  CULTURE, BLOOD (ROUTINE X 2)     Status: None   Collection Time    04/17/13  7:53 AM      Result Value Ref Range Status   Specimen Description BLOOD LEFT ARM   Final   Special Requests BOTTLES DRAWN AEROBIC AND ANAEROBIC 8CC   Final   Culture NO GROWTH 1 DAY   Final   Report Status PENDING   Incomplete    Medical History: Past Medical History  Diagnosis Date  . Hypertension   . Diabetes mellitus   . Colon polyp   . Obese   . PVC's (premature ventricular contractions)   .  IBS (irritable bowel syndrome)   . Hyperlipidemia   . PONV (postoperative nausea and vomiting)   . Chronic kidney disease   . Coronary artery disease   . COPD (chronic obstructive pulmonary disease)     Medications:  Scheduled:  . antiseptic oral rinse  15 mL Mouth Rinse q12n4p  . azithromycin  500 mg Intravenous Q24H  . cefTRIAXone (ROCEPHIN)  IV  1 g Intravenous Q24H  . chlorhexidine  15 mL Mouth Rinse BID  . heparin subcutaneous  5,000 Units Subcutaneous 3 times per day  . insulin aspart  0-15 Units Subcutaneous Q6H  . ipratropium-albuterol  3 mL Nebulization Q4H  . pantoprazole (PROTONIX) IV  40 mg Intravenous Daily  . sodium chloride  10-40 mL Intracatheter Q12H  . sodium chloride  3 mL Intravenous Q12H  . vancomycin  1,500 mg Intravenous Q48H   Assessment: 63 yo male admitted with CAP, sepsis.  He remains intubated on pressor support.  Fevers subsiding.  WBC has normalized & he is clinically improving per MD notes.   Renal function much improved.  24 I/O:  +774.37ml.     Zithromax 4/14  >> Rocephin 4/14 >> Vancomycin 4/14 >>  Goal of Therapy:  Vancomycin trough level 15-20 mcg/ml Eradicate infection.  Plan:  Increase Vancomycin 1000mg  IV q12h Check Vancomycin trough at steady state Continue Rocephin & Zithromax Monitor labs, renal fxn, and cultures Duration of therapy per MD.   Change to po once clinically appropriate.   Jerry Williams 04/18/2013,12:19 PM

## 2013-04-18 NOTE — Progress Notes (Signed)
Pt started weaning at 1048 and his pressors were turned off at 1045. RT waited 2 hours before she called to extubate because the doctors wanted his MAP above 65. The Map stayed above 65 during the 2 hour except for once and it was 58. RT took BP again and the Map was 66. RT called Dr. Luan Pulling and gave him the parameters NIF  1.1 L and FVC 900, pt followed direction. Pt was extubated to 4 L [place] and SATS were 99% and RT decreased Pt's O2 to 2 L [place]. HR 122 RR 22.Pt has a good cough and is able to talk.

## 2013-04-18 NOTE — Progress Notes (Signed)
Pt  Requesting to get to get OOB to recliner. Nursing staff x2 to assist pt from bed to chair. Pt tolerated transfer well. No complaints of any distress. Will cont to monitor.

## 2013-04-18 NOTE — Procedures (Signed)
Extubation Procedure Note  Patient Details:   Name: Jerry Williams DOB: [birth year] MRN: [id number]   Airway Documentation:  Airway (Active)  Secured at (cm) 24 cm 04/18/2013 10:49 AM  Measured From Lips 04/18/2013 10:49 AM  Secured Location Right 04/18/2013 10:49 AM  Secured By [employer] 04/18/2013 10:49 AM  Tube Holder Repositioned Yes 04/18/2013 10:49 AM  Cuff Pressure (cm H2O) 25 cm H2O 04/18/2013  8:49 AM  Site Condition Dry 04/18/2013  8:49 AM    Evaluation  O2 sats: stable throughout Complications: No apparent complications Patient did tolerate procedure well. Bilateral Breath Sounds: Diminished Suctioning: Oral Yes  [name] 04/18/2013, 1:13 PM

## 2013-04-18 NOTE — Progress Notes (Signed)
PROGRESS NOTE  Jerry Williams:[id number] DOB: 01-Aug-1950 DOA: 04/16/2013 PCP: No primary provider on file. [facility] Department?  Summary: 63 year old man with history of CABG, diabetes, COPD presented to the emergency department 4/14 with worsening shortness of breath, generalized weakness, intermittent chest pain. Admitted for sepsis with acute respiratory failure, hypotension thought related to pneumonia.  Assessment/Plan: 1. Sepsis with acute hypoxic respiratory failure requiring intubation and mechanical ventilation, acute renal failure, hypotension, encephalopathy, respiratory acidosis.  2. Acute hypoxic respiratory failure with acute respiratory acidosis. Appears stable at this point. FiO2 40% with good oxygenation. 3. CAP. Improving. Can likely be extubated soon. Fever curve may be trending downwards. 4. Acute renal failure likely ATN from sepsis complicated by ACE inhibitor, hydrochlorothiazide. Improving, excellent urine output, no change in mild lower extremity edema. 5. Acute encephalopathy secondary to acute respiratory acidosis. Resolved. 6. Anemia of critical illness. Stable. No evidence of bleeding. 7. Chest pain intermittent. No recurrence. Troponins negative. Followup echocardiogram. 8. Right-sided flank pain. Long-standing. Stable. No hydronephrosis. Secondary to chronic nephrolithiasis..  9. LE edema. Mild, stable. Etiology unclear. Consider RV failure, PHTN, OSA. 10. UTI. Urinalysis equivocal. Urine culture pending Escherichia coli. 11. Chronic diastolic dysfunction.  12. History of CAD, CABG 2012 13. DM. Stable. Hemoglobin A1c 8.2.  14. COPD, stopped smoking 1989. 15. OSA, BiPAP recommended in past by report, not on because of finances.   Overall much improved with resolving acute renal failure. Metabolic encephalopathy resolved. Oxygenating well on ventilator and can likely be extubated later today if can successfully  weaned off vasopressors. Vasopressors  have been weaned significantly overnight. Normotensive.  Continue antibiotics.  Followup echocardiogram    Discussed with wife at bedside Start tube feeds if not extubated today.Marland Kitchen  Pending studies:   Urine culture.  Blood cultures.  Code Status: full code DVT prophylaxis: heparin Family Communication:  Disposition Plan: home when improved  Murray Hodgkins, MD  [facility]  Pager [phone] If 7PM-7AM, please contact night-coverage at [email or web address], password [facility] 04/18/2013, 8:55 AM  LOS: 2 days   Consultants:  Pulmonology  Procedures:  Intubation 4/14 >>   Central line 4/14 >>   2-D echocardiogram: Pending.  Antibiotics:  Zithromax 4/14 >>   Ceftriaxone 4/14 >>   HPI/Subjective: No issues overnight. Patient has some right-sided flank pain long-standing. No new issues. Still remains on vasopressors.  Objective: Filed Vitals:   04/18/13 0615 04/18/13 0630 04/18/13 0809 04/18/13 0847  BP: 107/57 116/63    Pulse: 122 121    Temp:   100.8 F (38.2 C)   TempSrc:   Axillary   Resp: 23 26    Height:      Weight:      SpO2: 96% 96%  97%    Intake/Output Summary (Last 24 hours) at 04/18/13 0855 Last data filed at 04/18/13 0600  Gross per 24 hour  Intake 4091.46 ml  Output   2975 ml  Net 1116.46 ml     Filed Weights   04/16/13 1600 04/17/13 0500 04/18/13 0500  Weight: 115.5 kg (254 lb 10.1 oz) 115.6 kg (254 lb 13.6 oz) 120 kg (264 lb 8.8 oz)    Exam:   Febrile 101.8. high 90s on FiO2 40%. SBP 120s.  Gen. Appears calm, comfortable, awake and alert on ventilator. Follows commands appropriately.  Eyes. Pupils equal, round, reactive to light. Normal lids, irises.  ENT. Intubated. Lips appear unremarkable.  Neck. Appears grossly unremarkable.  Cardiovascular. Regular rate and rhythm. No murmur, rub or gallop. 1+ bilateral lower  extremity edema. Telemetry sinus tachycardia   Respiratory clear to auscultation bilaterally. No wheezes, rales  or rhonchi. Normal respiratory effort.  Abdomen soft, obese, nontender.   GU. Penis, scrotum appears grossly unremarkable.   Skin. Evidence of some chronic venous stasis changes bilateral lower extremities.  Psychiatric. Follows commands. Appears grossly normal.  Musculoskeletal. Grossly intact.  Data Reviewed:  +4.6 L since admission. Urine output 3.3 L  Capillary blood sugars stable.  BUN, creatinine much improved today. 40/1.43.  Hepatic function unremarkable.  Hemoglobin stable 11.6. No leukocytosis.  Scheduled Meds: . antiseptic oral rinse  15 mL Mouth Rinse q12n4p  . azithromycin  500 mg Intravenous Q24H  . cefTRIAXone (ROCEPHIN)  IV  1 g Intravenous Q24H  . chlorhexidine  15 mL Mouth Rinse BID  . heparin subcutaneous  5,000 Units Subcutaneous 3 times per day  . insulin aspart  0-15 Units Subcutaneous Q6H  . ipratropium-albuterol  3 mL Nebulization Q4H  . pantoprazole (PROTONIX) IV  40 mg Intravenous Daily  . sodium chloride  10-40 mL Intracatheter Q12H  . sodium chloride  3 mL Intravenous Q12H  . vancomycin  1,500 mg Intravenous Q48H   Continuous Infusions: . dextrose 5 % and 0.45% NaCl 125 mL/hr at 04/18/13 0218  . phenylephrine (NEO-SYNEPHRINE) Adult infusion 40 mcg/min (04/18/13 0530)    Principal Problem:   Sepsis Active Problems:   DIABETES MELLITUS, WITH NEUROLOGICAL COMPLICATIONS   HYPERTENSION   Chest pain   CAD (coronary artery disease)   COPD (chronic obstructive pulmonary disease)   Chronic kidney disease   Hyperlipidemia   Obese   CAP (community acquired pneumonia)   Acute on chronic renal failure   Acute respiratory failure   Acute encephalopathy   Time spent 30 minutes

## 2013-04-18 NOTE — Progress Notes (Signed)
Pt resting in bed, eyes open, following commands, Denies pain, VSS, no sedation present. Neomycin drip infusing at 38mcg/hour. MD has asked to wean patient off the neomycin drip today. wife is at the bedside. Will continue to monitor.

## 2013-04-19 ENCOUNTER — Encounter (HOSPITAL_COMMUNITY): Payer: Self-pay | Admitting: Adult Health

## 2013-04-19 ENCOUNTER — Inpatient Hospital Stay (HOSPITAL_COMMUNITY): Payer: BC Managed Care – PPO

## 2013-04-19 DIAGNOSIS — I2510 Atherosclerotic heart disease of native coronary artery without angina pectoris: Secondary | ICD-10-CM

## 2013-04-19 DIAGNOSIS — I10 Essential (primary) hypertension: Secondary | ICD-10-CM

## 2013-04-19 DIAGNOSIS — N179 Acute kidney failure, unspecified: Secondary | ICD-10-CM

## 2013-04-19 DIAGNOSIS — N189 Chronic kidney disease, unspecified: Secondary | ICD-10-CM

## 2013-04-19 LAB — GLUCOSE, CAPILLARY
GLUCOSE-CAPILLARY: 257 mg/dL — AB (ref 70–99)
GLUCOSE-CAPILLARY: 293 mg/dL — AB (ref 70–99)
Glucose-Capillary: 171 mg/dL — ABNORMAL HIGH (ref 70–99)
Glucose-Capillary: 175 mg/dL — ABNORMAL HIGH (ref 70–99)
Glucose-Capillary: 322 mg/dL — ABNORMAL HIGH (ref 70–99)

## 2013-04-19 LAB — BASIC METABOLIC PANEL
BUN: 16 mg/dL (ref 6–23)
CALCIUM: 8 mg/dL — AB (ref 8.4–10.5)
CO2: 28 mEq/L (ref 19–32)
CREATININE: 0.99 mg/dL (ref 0.50–1.35)
Chloride: 97 mEq/L (ref 96–112)
GFR calc Af Amer: >90 mL/min (ref >90)
GFR, EST NON AFRICAN AMERICAN: 87 mL/min — AB (ref >90)
GLUCOSE: 189 mg/dL — AB (ref 70–99)
Potassium: 3.9 mEq/L (ref 3.7–5.3)
Sodium: 135 mEq/L — ABNORMAL LOW (ref 137–147)

## 2013-04-19 MED ORDER — INSULIN ASPART 100 UNIT/ML ~~LOC~~ SOLN
0.0000 [IU] | Freq: Every day | SUBCUTANEOUS | Status: DC
  Administered 2013-04-19 – 2013-04-20 (×2): 3 [IU] via SUBCUTANEOUS
  Administered 2013-04-21: 4 [IU] via SUBCUTANEOUS

## 2013-04-19 MED ORDER — METOPROLOL TARTRATE 50 MG PO TABS
50.0000 mg | ORAL_TABLET | Freq: Two times a day (BID) | ORAL | Status: DC
  Administered 2013-04-19: 50 mg via ORAL
  Filled 2013-04-19: qty 1

## 2013-04-19 MED ORDER — SIMVASTATIN 10 MG PO TABS
10.0000 mg | ORAL_TABLET | Freq: Every day | ORAL | Status: DC
  Administered 2013-04-19 – 2013-04-21 (×3): 10 mg via ORAL
  Filled 2013-04-19 (×3): qty 1

## 2013-04-19 MED ORDER — METOPROLOL TARTRATE 25 MG PO TABS
25.0000 mg | ORAL_TABLET | Freq: Two times a day (BID) | ORAL | Status: DC
  Administered 2013-04-19: 25 mg via ORAL
  Filled 2013-04-19: qty 1

## 2013-04-19 MED ORDER — ASPIRIN EC 325 MG PO TBEC
325.0000 mg | DELAYED_RELEASE_TABLET | Freq: Every day | ORAL | Status: DC
  Administered 2013-04-19 – 2013-04-22 (×4): 325 mg via ORAL
  Filled 2013-04-19 (×4): qty 1

## 2013-04-19 MED ORDER — METOPROLOL TARTRATE 50 MG PO TABS
50.0000 mg | ORAL_TABLET | Freq: Once | ORAL | Status: AC
  Administered 2013-04-19: 50 mg via ORAL
  Filled 2013-04-19: qty 1

## 2013-04-19 MED ORDER — INSULIN ASPART 100 UNIT/ML ~~LOC~~ SOLN: 0.0000 [IU] | Freq: Three times a day (TID) | SUBCUTANEOUS | Status: DC

## 2013-04-19 MED ORDER — INSULIN ASPART 100 UNIT/ML ~~LOC~~ SOLN
0.0000 [IU] | Freq: Three times a day (TID) | SUBCUTANEOUS | Status: DC
  Administered 2013-04-20: 5 [IU] via SUBCUTANEOUS
  Administered 2013-04-20 – 2013-04-21 (×4): 8 [IU] via SUBCUTANEOUS
  Administered 2013-04-21: 11 [IU] via SUBCUTANEOUS
  Administered 2013-04-22: 8 [IU] via SUBCUTANEOUS
  Administered 2013-04-22: 15 [IU] via SUBCUTANEOUS

## 2013-04-19 NOTE — Progress Notes (Signed)
NUTRITION FOLLOW UP  Intervention:   Continue with current nutrition plan of care  Nutrition Dx:   Inadequate oral intake related to inability to eat as evidenced by NPO, mechanical ventilation; progressing  Goal:   Pt will meet >90% of estimated nutritional needs  Monitor:   Po intake, skin assessment, labs, weight changes, I/O's  Assessment:   Pt was extubated on 04/18/13. BP has been stabilized. Tube feedings were not initiated while on the vent. Since being extubated, pt was ordered a full liquid diet, which he tolerated well, and was recently upgraded to a dysphagia 3 diet. No PO data currently available.  Pt with a 8# (3.1%) wt gain x 3 days, likely due to fluid retention.   Height: Ht Readings from Last 1 Encounters:  04/16/13 5\' 11"  (1.803 m)    Weight Status:   Wt Readings from Last 1 Encounters:  04/19/13 262 lb 12.6 oz (119.2 kg)    Re-estimated needs:  Kcal: 1900-2100 daily Protein: 93-116 grams daily Fluid: 1.9-2.1 L daily  Skin: WDL  Diet Order: Dysphagia   Intake/Output Summary (Last 24 hours) at 04/19/13 1412 Last data filed at 04/19/13 0500  Gross per 24 hour  Intake    920 ml  Output   1675 ml  Net   -755 ml    Last BM: 04/15/13   Labs:   Recent Labs Lab 04/17/13 0259 04/18/13 0505 04/19/13 0759  NA 136* 136* 135*  K 4.3 4.1 3.9  CL 98 96 97  CO2 26 26 28   BUN 77* 40* 16  CREATININE 2.80* 1.43* 0.99  CALCIUM 7.9* 8.1* 8.0*  GLUCOSE 56* 220* 189*    CBG (last 3)   Recent Labs  04/19/13 0613 04/19/13 0736 04/19/13 1127  GLUCAP 171* 175* 322*    Scheduled Meds: . antiseptic oral rinse  15 mL Mouth Rinse q12n4p  . aspirin EC  325 mg Oral Daily  . azithromycin  500 mg Intravenous Q24H  . cefTRIAXone (ROCEPHIN)  IV  1 g Intravenous Q24H  . chlorhexidine  15 mL Mouth Rinse BID  . heparin subcutaneous  5,000 Units Subcutaneous 3 times per day  . insulin aspart  0-15 Units Subcutaneous Q6H  . ipratropium-albuterol  3 mL  Nebulization QID  . metoprolol tartrate  25 mg Oral BID  . pantoprazole (PROTONIX) IV  40 mg Intravenous Daily  . simvastatin  10 mg Oral q1800  . sodium chloride  10-40 mL Intracatheter Q12H  . sodium chloride  3 mL Intravenous Q12H  . vancomycin  1,000 mg Intravenous Q12H    Continuous Infusions:   Teshaun Olarte A. Jimmye Norman, RD, LDN Pager: [phone]

## 2013-04-19 NOTE — Progress Notes (Signed)
PROGRESS NOTE  Jerry Williams:[id number] DOB: 1950-05-26 DOA: 04/16/2013 PCP: No primary provider on file. [facility] Department?  Summary: 63 year old man with history of CABG, diabetes, COPD presented to the emergency department 4/14 with worsening shortness of breath, generalized weakness, intermittent chest pain. Admitted for sepsis with acute respiratory failure, hypotension thought related to pneumonia.  Assessment/Plan: 1. Sepsis with acute hypoxic respiratory failure requiring intubation and mechanical ventilation, acute renal failure, hypotension, encephalopathy, respiratory acidosis. All issues much improved. 2. Acute hypoxic respiratory failure with acute respiratory acidosis. Resolving, status post extubation 4/16. 3. CAP. Improving. Can likely be extubated soon. Fever curve may be trending downwards. 4. Acute renal failure likely ATN from sepsis complicated by ACE inhibitor, hydrochlorothiazide. Resolved. Creatinine normal. 5. Acute encephalopathy secondary to acute respiratory acidosis. Resolved. 6. Anemia of critical illness. Stable. No evidence of bleeding. 7. Chest pain intermittent. No recurrence. Troponins negative. Followup echocardiogram demonstrated LVEF 40-45% with wall motion abnormalities consistent with ischemic cardiomyopathy. 8. Right-sided flank pain. Long-standing. Stable. No hydronephrosis. Secondary to chronic nephrolithiasis..  9. LE edema. Mild, stable. Etiology unclear. Consider RV failure, PHTN, OSA. 10. UTI. Urinalysis equivocal. Urine culture pending Escherichia coli. 11. History of CAD, CABG 2012 12. DM. Stable. Hemoglobin A1c 8.2. Resume metformin, Januvia on discharge. 13. COPD, stopped smoking 1989. 14. OSA, BiPAP recommended in past by report, not on because of finances.   Clinically much improved status post extubation yesterday. Plan to continue antibiotics IV today. Likely transfer to the floor later today.  Resume beta blocker at lower  dose. Consider ACEI 4/18.  Advance diet to soft mechanical.  Cardiology evaluation for ischemic cardiomyopathy.  BiPAP each night for obstructive sleep apnea  Pending studies:   Blood cultures no growth today.  Code Status: full code DVT prophylaxis: heparin Family Communication:  Disposition Plan: home when improved  Murray Hodgkins, MD  [facility]  Pager [phone] If 7PM-7AM, please contact night-coverage at [email or web address], password [facility] 04/19/2013, 8:29 AM  LOS: 3 days   Consultants:  Pulmonology  Procedures:  Intubation 4/14 >> 4/16  Central line 4/14 >>   2-D echocardiogram: Left ventricular ejection fraction 40-45%, wall motion abnormalities consistent with ischemic cardiomyopathy. Grade 2 diastolic dysfunction.  Antibiotics:  Zithromax 4/14 >>   Ceftriaxone 4/14 >>   HPI/Subjective: Did well overnight. No pain. Tolerated liquids. Breathing fine. No complaints. Blood pressure is stabilized, now hypertensive.  Objective: Filed Vitals:   04/19/13 0400 04/19/13 0500 04/19/13 0600 04/19/13 0807  BP:   152/77   Pulse:   105   Temp: 98.1 F (36.7 C)     TempSrc: Oral     Resp:   25   Height:      Weight:  119.2 kg (262 lb 12.6 oz)    SpO2:   94% 97%    Intake/Output Summary (Last 24 hours) at 04/19/13 0829 Last data filed at 04/19/13 0500  Gross per 24 hour  Intake   1120 ml  Output   1675 ml  Net   -555 ml     Filed Weights   04/17/13 0500 04/18/13 0500 04/19/13 0500  Weight: 115.6 kg (254 lb 13.6 oz) 120 kg (264 lb 8.8 oz) 119.2 kg (262 lb 12.6 oz)    Exam:   Tmax 100.1. Hypertensive. Stable hypoxia on 2 L.  Gen. Appears calm, comfortable, awake and alert on ventilator.   Eyes. Pupils equal, round, reactive to light. Normal lids, irises.  ENT. Appears grossly unremarkable.  Cardiovascular. Regular rate and rhythm.  No murmur, rub or gallop. 1+ bilateral lower extremity edema. Telemetry sinus tachycardia   Respiratory clear to  auscultation bilaterally. No wheezes, rales or rhonchi. Normal respiratory effort.  Abdomen soft, obese, nontender.   Skin. Grossly unremarkable.  Psychiatric. Appears grossly normal. Speech fluent and clear.  Musculoskeletal. Grossly unremarkable.  Data Reviewed:  +4.1 L since admission. Urine output 1.6 L. Weight stable.  Capillary blood sugars stable.  BUN and creatinine have normalized. Basic metabolic panel unremarkable.  Scheduled Meds: . antiseptic oral rinse  15 mL Mouth Rinse q12n4p  . azithromycin  500 mg Intravenous Q24H  . cefTRIAXone (ROCEPHIN)  IV  1 g Intravenous Q24H  . chlorhexidine  15 mL Mouth Rinse BID  . heparin subcutaneous  5,000 Units Subcutaneous 3 times per day  . insulin aspart  0-15 Units Subcutaneous Q6H  . ipratropium-albuterol  3 mL Nebulization QID  . pantoprazole (PROTONIX) IV  40 mg Intravenous Daily  . sodium chloride  10-40 mL Intracatheter Q12H  . sodium chloride  3 mL Intravenous Q12H  . vancomycin  1,000 mg Intravenous Q12H   Continuous Infusions: . sodium chloride 50 mL/hr at 04/19/13 0631  . phenylephrine (NEO-SYNEPHRINE) Adult infusion 30.133 mcg/min (04/18/13 0955)    Principal Problem:   Sepsis Active Problems:   DIABETES MELLITUS, WITH NEUROLOGICAL COMPLICATIONS   HYPERTENSION   Chest pain   CAD (coronary artery disease)   COPD (chronic obstructive pulmonary disease)   Chronic kidney disease   Hyperlipidemia   Obese   CAP (community acquired pneumonia)   Acute on chronic renal failure   Acute respiratory failure   Acute encephalopathy   Time spent 20 minutes

## 2013-04-19 NOTE — Consult Note (Signed)
CARDIOLOGY CONSULT NOTE   Patient ID: Jerry Williams MRN: [id number] DOB/AGE: 1950-10-11 63 y.o.  Admit Date: 04/16/2013 Referring Physician: PTH Primary Physician: [facility] Consulting Cardiologist: Rozann Lesches MD Primary Cardiologist: Dr. Kennith Center, Rondall Allegra  Reason for Consultation: Cardiomyopathy with known CAD  Clinical Summary Jerry Williams is a 63 y.o.male admitted with complaints of shortness of breath, diagnosed with sepsis and CAP; He has a known history of CAD s/p CABG 2012 in [facility] and is followed by Dr. Kennith Center. He has an ejection fraction of 30-35% per most recent documentation.   The patient states symptoms began approximately one week ago with what he described as an aching throbbing pain in his lower back which she felt was related to an chronic kidney stones. He has a lengthy history of this. However, his breathing status deteriorated he began to have chills and fever. He [facility] but while in the waiting area began to have significant fever and chills which appeared to be a seizure. He was sent directly to [facility] via EMS.  On arrival to the emergency room the patient was afebrile with a temperature 98.1, blood pressure 152/76 with a heart rate of 106 O2 sat 95%. The patient's chest x-ray revealed patchy lower lobe infiltrate minimal atelectatic change, small right pleural effusion. The patient was treated with IV fluids, began on Rocephin, Zithromax, and Toradol for pain.  The patient required intubation on 04/16/2013 due to deterioration. Patient was extubated 2 days later. Continues to be followed by Pulmonary. We are asked to see secondary to his cardiac history. He has had an echocardiogram completed this admission demonstrating ejection fraction of 40-45% with grade 2 diastolic dysfunction.Marland Kitchen  He is feeling much better, but continues to mild shortness of breath and  coughing. Denies any chest pain.   No Known Allergies  Medications Scheduled Medications: . antiseptic oral rinse  15 mL Mouth Rinse q12n4p  . azithromycin  500 mg Intravenous Q24H  . cefTRIAXone (ROCEPHIN)  IV  1 g Intravenous Q24H  . chlorhexidine  15 mL Mouth Rinse BID  . heparin subcutaneous  5,000 Units Subcutaneous 3 times per day  . insulin aspart  0-15 Units Subcutaneous Q6H  . ipratropium-albuterol  3 mL Nebulization QID  . metoprolol tartrate  25 mg Oral BID  . pantoprazole (PROTONIX) IV  40 mg Intravenous Daily  . sodium chloride  10-40 mL Intracatheter Q12H  . sodium chloride  3 mL Intravenous Q12H  . vancomycin  1,000 mg Intravenous Q12H    PRN Medications: acetaminophen, acetaminophen, albuterol, ondansetron (ZOFRAN) IV, ondansetron, sodium chloride   Past Medical History  Diagnosis Date  . Hypertension   . Diabetes mellitus   . Colon polyp   . Obese   . PVC's (premature ventricular contractions)   . IBS (irritable bowel syndrome)   . Hyperlipidemia   . PONV (postoperative nausea and vomiting)   . Chronic kidney disease   . Coronary artery disease 02/2010    Followed by Dr. Kennith Center [facility]  . COPD (chronic obstructive pulmonary disease)   . OSA (obstructive sleep apnea) 01/2013    Needs BiPAP not afforded    Past Surgical History  Procedure Laterality Date  . Cardiac catheterization  09/2010    Dr. Kennith Center [facility]  . Cystectomy      Scrotal abscess  . Coronary artery bypass graft  09/2010    [facility]-Dr.  Kincaid  . Cholecystectomy  02/14/2011    Procedure: LAPAROSCOPIC CHOLECYSTECTOMY;  Surgeon: Donato Heinz, MD;  Location: [facility];  Service: General;  Laterality: N/A;    Family History  Problem Relation Age of Onset  . Hypertension Mother   . Emphysema Father   . Heart disease Sister     Enlarged heart    Social History Jerry Williams reports that he has quit smoking. He does not have any smokeless tobacco history on file. Jerry Williams reports  that he does not drink alcohol.  Review of Systems Otherwise reviewed and negative except as outlined.  Physical Examination Blood pressure 134/63, pulse 91, temperature 99.2 F (37.3 C), temperature source Oral, resp. rate 18, height 5\' 11"  (1.803 m), weight 262 lb 12.6 oz (119.2 kg), SpO2 96.00%.  Intake/Output Summary (Last 24 hours) at 04/19/13 1108 Last data filed at 04/19/13 0500  Gross per 24 hour  Intake   1070 ml  Output   1675 ml  Net   -605 ml    Telemetry: NSR with frequent PVC;s and PAC's  GEN:No acute distress. Breathing better. HEENT: Conjunctiva and lids normal, oropharynx clear with moist mucosa. Neck: Supple, no elevated JVP or carotid bruits, no thyromegaly. Lungs:Right lower lobe diminished to absent lung sounds, clear otherwise without wheezes. Cardiac: Regular rate and rhythm, frequent PVC's no S3 or significant systolic murmur, no pericardial rub. Abdomen: Soft, nontender, no hepatomegaly, bowel sounds present, no guarding or rebound. Extremities: Generalized LE pitting edema, distal pulses 2+. Skin: Warm and dry. Musculoskeletal: No kyphosis. Neuropsychiatric: Alert and oriented x3, affect grossly appropriate.   Prior Cardiac Testing/Procedures 1.Cardiac Cath 09/2010-Dr [facility]  2, 4 vessel CABG 09/2010 Dr. Lunette Stands [facility] 3. Echocardiogram 04/16/2013 Left ventricle: The cavity size was mildly dilated. Wall thickness was increased in a pattern of mild LVH. Systolic function was mildly to moderately reduced. The estimated ejection fraction was in the range of 40% to 45%. There is hypokinesis of the basal-midinferolateral myocardium. There is hypokinesis of the distalanteroseptal myocardium. Features are consistent with a pseudonormal left ventricular filling pattern, with concomitant abnormal relaxation and increased filling pressure (grade 2 diastolic dysfunction). - Aortic valve: Mildly calcified annulus. Trileaflet; mildly thickened  leaflets. No significant regurgitation. - Mitral valve: Trivial regurgitation. - Left atrium: The atrium was moderately dilated. - Right atrium: The atrium was mildly dilated. Central venous pressure: 36mm Hg (est). - Tricuspid valve: Mild regurgitation. - Pulmonary arteries: Systolic pressure was moderately increased. PA peak pressure: 88mm Hg (S). - Pericardium, extracardiac: There was no pericardial effusion.   Lab Results  Basic Metabolic Panel:  Recent Labs Lab 04/16/13 1227 04/17/13 0259 04/18/13 0505 04/19/13 0759  NA 135* 136* 136* 135*  K 4.4 4.3 4.1 3.9  CL 94* 98 96 97  CO2 26 26 26 28   GLUCOSE 70 56* 220* 189*  BUN 71* 77* 40* 16  CREATININE 2.63* 2.80* 1.43* 0.99  CALCIUM 9.0 7.9* 8.1* 8.0*    Liver Function Tests:  Recent Labs Lab 04/17/13 0259 04/18/13 0505  AST 40* 31  ALT 17 16  ALKPHOS 110 123*  BILITOT 0.5 0.7  PROT 6.5 7.0  ALBUMIN 2.2* 2.3*    CBC:  Recent Labs Lab 04/16/13 1227 04/17/13 0259 04/18/13 0505  WBC 12.2* 7.9 8.0  NEUTROABS 8.9*  --   --   HGB 12.2* 10.8* 11.6*  HCT 39.8 35.9* 39.1  MCV 74.8* 75.7* 76.1*  PLT 197 172 176    Cardiac Enzymes:  Recent  Labs Lab 04/16/13 1227 04/16/13 2049 04/17/13 0259  TROPONINI <0.30 <0.30 <0.30    Radiology: US Renal  04/17/2013   CLINICAL DATA:  acute renal failure  EXAM: RENAL/URINARY TRACT ULTRASOUND COMPLETE  COMPARISON:  DG ABD ACUTE W/CHEST dated 04/16/2013; CT ABD/PELVIS W CM dated 02/11/2011  FINDINGS: Right Kidney:  Length: 15.9 cm. Echogenicity within normal limits. Multiple echogenic foci in the medullary portion of the kidney with acoustic shadowing. The largest area measures 4.3 cm in longitudinal dimensions. A sinus consistent with nonobstructing medullary calculi. There is evidence hydronephrosis nor masses.  Left Kidney:  Length: 16 cm. Echogenicity within normal limits. Small punctate echogenic foci is appreciated within the medullary portion of the kidney  consistent with nonobstructing calculi. There is no evidence of hydronephrosis nor masses.  Bladder:  Decompressed secondary to Foley catheter placement.  IMPRESSION: Bilateral nonobstructing medullary calculi largest on the right. No further sonographic abnormalities.   Electronically Signed   By: Margaree Mackintosh M.D.   On: 04/17/2013 13:44   Dg Chest Port 1 View  04/19/2013   CLINICAL DATA:  Respiratory failure  EXAM: PORTABLE CHEST - 1 VIEW  COMPARISON:  04/18/2013  FINDINGS: Cardiac shadow is stable. Postsurgical changes are again seen. The endotracheal tube and nasogastric catheter been removed in the interval. Persistent changes are noted in the bases bilaterally. No new focal abnormality is seen. A left-sided subclavian line is again seen at the proximal superior vena cava.  IMPRESSION: No significant change from the prior exam with the exception of removal of endotracheal and nasogastric tubes.   Electronically Signed   By: Inez Catalina M.D.   On: 04/19/2013 07:41   Dg Chest Port 1 View  04/18/2013   CLINICAL DATA:  Respiratory failure, hypertension, diabetes, COPD  EXAM: PORTABLE CHEST - 1 VIEW  COMPARISON:  Portable exam 0923 hr compared to 04/17/2013  FINDINGS: Tip of endotracheal tube projects approximately 5.2 cm above carina.  Nasogastric tube is seen into the mid thorax, tip not localized.  Left subclavian central venous catheter tip projects over left brachiocephalic vein short of the SVC confluence.  Enlargement of cardiac silhouette with pulmonary vascular congestion.  Bibasilar atelectasis and question right pleural effusion.  No pneumothorax.  IMPRESSION: Bibasilar atelectasis and small right pleural effusion.  Enlargement of cardiac silhouette with pulmonary vascular congestion.   Electronically Signed   By: Lavonia Dana M.D.   On: 04/18/2013 10:11     ECG: NSR with LBBB PAC's    Impression  1. Community acquired pneumonia with sepsis and respiratory failure status post mechanical  ventilation, now extubated and clinically improving, hemodynamically stable. This is being managed by the hospitalist team and pulmonary medicine.  2. History of multivessel CAD status post CABG in 2012 in [place], continues to follow with cardiology at that facility, Dr. Kennith Center. He reports no progressive angina symptoms leading up to his current presentation, no cardiac hospitalizations. Cardiac markers normal arguing against ACS. Prior LVEF had been in the range of 30-35%, now 40-45% by echocardiogram done yesterday. We reviewed his outpatient cardiac regimen which includes aspirin, metoprolol, benazepril HCTZ, and Mevacor.  3. Ischemic cardiomyopathy, LVEF 40-45% this point. Pro-BNP was 8900 on April 15. Most recent chest x-ray still consistent with pneumonia.  4. Type 2 diabetes mellitus, on Janumet and Prandin as an outpatient.   Recommendations  Discussed with patient and wife at bedside. He has been clinically improving with current management. Recommend resuming aspirin, continue current dose of metoprolol, add back Mevacor. Depending  on blood pressure stabilization, can eventually aim toward adding back benazepril HCTZ as well. No clear indication for followup ischemic testing at this time. Follow telemetry. Anticipate that he will keep his regular followup with primary cardiologist in [place].   Signed: Phill Myron. Purcell Nails NP [facility] 04/19/2013, 11:08 AM Co-Sign MD   Attending note:  Patient seen and examined. Reviewed records and modified above note by Ms. Lawrence NP, including completion of the entire impression and recommendations sections, which reflect my findings and recommendations.  Satira Sark, M.D., F.A.C.C.

## 2013-04-20 LAB — GLUCOSE, CAPILLARY
GLUCOSE-CAPILLARY: 224 mg/dL — AB (ref 70–99)
Glucose-Capillary: 281 mg/dL — ABNORMAL HIGH (ref 70–99)
Glucose-Capillary: 269 mg/dL — ABNORMAL HIGH (ref 70–99)
Glucose-Capillary: 295 mg/dL — ABNORMAL HIGH (ref 70–99)

## 2013-04-20 MED ORDER — METOPROLOL TARTRATE 50 MG PO TABS
100.0000 mg | ORAL_TABLET | Freq: Two times a day (BID) | ORAL | Status: DC
Start: 2013-04-20 — End: 2013-04-22
  Administered 2013-04-20 – 2013-04-22 (×5): 100 mg via ORAL
  Filled 2013-04-20 (×4): qty 2

## 2013-04-20 MED ORDER — BIOTENE DRY MOUTH MT LIQD
15.0000 mL | Freq: Two times a day (BID) | OROMUCOSAL | Status: DC
  Administered 2013-04-21 – 2013-04-22 (×3): 15 mL via OROMUCOSAL

## 2013-04-20 NOTE — Progress Notes (Signed)
PROGRESS NOTE  Jerry Williams VOJ:[id number] DOB: Dec 14, 1950 DOA: 04/16/2013 PCP: No primary provider on file. [facility] Department?  Summary: 63 year old man with history of CABG, diabetes, COPD presented to the emergency department 4/14 with worsening shortness of breath, generalized weakness, intermittent chest pain. Admitted for sepsis with acute respiratory failure, hypotension thought related to pneumonia.  Assessment/Plan: 1. Sepsis with acute hypoxic respiratory failure requiring intubation and mechanical ventilation, acute renal failure, hypotension, encephalopathy, respiratory acidosis. All issues continue to improve. 2. Acute hypoxic respiratory failure with acute respiratory acidosis. Resolving, status post extubation 4/16. 3. CAP. Continues to improve. Afebrile. 4. Acute renal failure likely ATN from sepsis complicated by ACE inhibitor, hydrochlorothiazide. Resolved. Creatinine normal. 5. Acute encephalopathy secondary to acute respiratory acidosis. Resolved. 6. Anemia of critical illness. Stable. No evidence of bleeding. 7. Chest pain intermittent. No recurrence. Troponins negative. Followup echocardiogram demonstrated LVEF 40-45% with wall motion abnormalities consistent with ischemic cardiomyopathy. Followup with cardiology as an outpatient. 8. Right-sided flank pain. Long-standing. Stable. No hydronephrosis. Secondary to chronic nephrolithiasis. 9. LE edema. Stable likely secondary to systolic dysfunction. 10. UTI. Escherichia coli. Treated. 11. History of CAD, CABG 2012 12. DM. Stable. Hemoglobin A1c 8.2. Resume metformin, Januvia, Prandin on discharge. 13. COPD, stopped smoking 1989. 14. OSA, BiPAP recommended in past by report, not on because of finances.   Improving rapidly now. Likely home 48 hours. Continue antibiotics.  Place peripheral line, remove central line.  Wean oxygen.  Discussed with wife at bedside.  Pending studies:   Blood cultures no growth  to date  Code Status: full code DVT prophylaxis: heparin Family Communication:  Disposition Plan: home when improved  Murray Hodgkins, MD  [facility]  Pager [phone] If 7PM-7AM, please contact night-coverage at [email or web address], password [facility] 04/20/2013, 2:19 PM  LOS: 4 days   Consultants:  Pulmonology  Procedures:  Intubation 4/14 >> 4/16  Central line 4/14 >>   2-D echocardiogram: Left ventricular ejection fraction 40-45%, wall motion abnormalities consistent with ischemic cardiomyopathy. Grade 2 diastolic dysfunction.  Antibiotics:  Zithromax 4/14 >>   Ceftriaxone 4/14 >>   Vancomycin 4/14 >>   HPI/Subjective: No issues overnight. Feeling well. No complaints.  Objective: Filed Vitals:   04/20/13 0712 04/20/13 0858 04/20/13 1006 04/20/13 1127  BP:  126/72 105/67   Pulse:   85   Temp:   97.9 F (36.6 C)   TempSrc:   Oral   Resp:   20   Height:      Weight:      SpO2: 91%  91% 99%    Intake/Output Summary (Last 24 hours) at 04/20/13 1419 Last data filed at 04/20/13 0946  Gross per 24 hour  Intake   1440 ml  Output   1750 ml  Net   -310 ml     Filed Weights   04/18/13 0500 04/19/13 0500 04/20/13 0628  Weight: 120 kg (264 lb 8.8 oz) 119.2 kg (262 lb 12.6 oz) 115.9 kg (255 lb 8.2 oz)    Exam:   Afebrile, vital signs stable. Stable hypoxia.  Gen. Appears comfortable, calm, sitting in chair. Appears well.  Cardiovascular regular rate and rhythm. No murmur, rub or gallop.  Respiratory clear to auscultation bilaterally. No wheezes, rales or rhonchi. Normal respiratory effort.  Psychiatric grossly normal mood and affect. Speech fluent and appropriate.   Data Reviewed:  +3.7 L since admission. Urine output 1.6 L. Weight stable. Negative I/O last 24 hours.  Capillary blood sugars stable.  EKG/rhythm with left normal branch  block, no acute changes.  Scheduled Meds: . antiseptic oral rinse  15 mL Mouth Rinse q12n4p  . aspirin EC  325 mg  Oral Daily  . azithromycin  500 mg Intravenous Q24H  . cefTRIAXone (ROCEPHIN)  IV  1 g Intravenous Q24H  . chlorhexidine  15 mL Mouth Rinse BID  . heparin subcutaneous  5,000 Units Subcutaneous 3 times per day  . insulin aspart  0-15 Units Subcutaneous TID WC  . insulin aspart  0-5 Units Subcutaneous QHS  . ipratropium-albuterol  3 mL Nebulization QID  . metoprolol tartrate  100 mg Oral BID  . pantoprazole (PROTONIX) IV  40 mg Intravenous Daily  . simvastatin  10 mg Oral q1800  . sodium chloride  10-40 mL Intracatheter Q12H  . sodium chloride  3 mL Intravenous Q12H  . vancomycin  1,000 mg Intravenous Q12H   Continuous Infusions:    Principal Problem:   Sepsis Active Problems:   DIABETES MELLITUS, WITH NEUROLOGICAL COMPLICATIONS   HYPERTENSION   Chest pain   CAD (coronary artery disease)   COPD (chronic obstructive pulmonary disease)   Chronic kidney disease   Hyperlipidemia   Obese   CAP (community acquired pneumonia)   Acute on chronic renal failure   Acute respiratory failure   Acute encephalopathy   Time spent 20 minutes

## 2013-04-20 NOTE — Progress Notes (Signed)
Pt not in need of BIPAP at this time but unit is on standby if needed.Pt wearing 3lpm [place].

## 2013-04-20 NOTE — Progress Notes (Signed)
Central line removed per MD order. Proper procedure was followed pt tolerated procedure well. Pressure was held for 5 minutes and pressure dressing with Vaseline impregnated gauze was placed. Will continue to monitor.

## 2013-04-21 LAB — GLUCOSE, CAPILLARY
GLUCOSE-CAPILLARY: 261 mg/dL — AB (ref 70–99)
GLUCOSE-CAPILLARY: 299 mg/dL — AB (ref 70–99)
Glucose-Capillary: 321 mg/dL — ABNORMAL HIGH (ref 70–99)

## 2013-04-21 MED ORDER — HYDROCHLOROTHIAZIDE 12.5 MG PO CAPS
12.5000 mg | ORAL_CAPSULE | Freq: Every day | ORAL | Status: DC
  Administered 2013-04-21 – 2013-04-22 (×2): 12.5 mg via ORAL
  Filled 2013-04-21 (×2): qty 1

## 2013-04-21 MED ORDER — AZITHROMYCIN 250 MG PO TABS: 500.0000 mg | ORAL_TABLET | Freq: Every day | ORAL | Status: DC

## 2013-04-21 MED ORDER — FUROSEMIDE 40 MG PO TABS
40.0000 mg | ORAL_TABLET | Freq: Once | ORAL | Status: AC
  Administered 2013-04-21: 40 mg via ORAL
  Filled 2013-04-21: qty 1

## 2013-04-21 MED ORDER — CEFUROXIME AXETIL 250 MG PO TABS
500.0000 mg | ORAL_TABLET | Freq: Two times a day (BID) | ORAL | Status: DC
  Administered 2013-04-22: 500 mg via ORAL
  Filled 2013-04-21: qty 2

## 2013-04-21 MED ORDER — PANTOPRAZOLE SODIUM 40 MG PO TBEC
40.0000 mg | DELAYED_RELEASE_TABLET | Freq: Every day | ORAL | Status: DC
  Administered 2013-04-22: 40 mg via ORAL
  Filled 2013-04-21 (×2): qty 1

## 2013-04-21 NOTE — Progress Notes (Signed)
He says he feels better. He still has some shortness of breath that he and his family relate to lying down. His chest is much clearer and he looks much better. I will plan to follow more peripherally now  Thanks for allow me to see him with you

## 2013-04-21 NOTE — Progress Notes (Signed)
PROGRESS NOTE  Jerry Williams PNT:[id number] DOB: 1950-07-02 DOA: 04/16/2013 PCP: No primary provider on file. [facility] Department?  Summary: 63 year old man with history of CABG, diabetes, COPD presented to the emergency department 4/14 with worsening shortness of breath, generalized weakness, intermittent chest pain. Admitted for sepsis with acute respiratory failure, hypotension thought related to pneumonia.  Assessment/Plan: 1. Sepsis with acute hypoxic respiratory failure requiring intubation and mechanical ventilation, acute renal failure, hypotension, encephalopathy, respiratory acidosis. All issues stable. 2. Acute hypoxic respiratory failure with acute respiratory acidosis. Stable status post extubation 4/16. Wean oxygen. 3. CAP. Appears clinically resolved. Afebrile. 4. Acute renal failure likely ATN from sepsis complicated by ACE inhibitor, hydrochlorothiazide. Resolved. Creatinine normal. Resume hydrochlorothiazide, ACE inhibitor. 5. Acute encephalopathy secondary to acute respiratory acidosis. Resolved. 6. Anemia of critical illness. Stable. No evidence of bleeding. 7. Chest pain intermittent. No recurrence. Troponins negative. Followup echocardiogram demonstrated LVEF 40-45% with wall motion abnormalities consistent with ischemic cardiomyopathy. Followup with cardiology as an outpatient. 8. Right-sided flank pain. Resolved. Long-standing.  No hydronephrosis. Secondary to chronic nephrolithiasis. 9. LE edema. Slightly worse today. Acute on chronic, long-standing. 10. UTI. Escherichia coli. Treated. 11. History of CAD, CABG 2012 12. DM. Stable. Hemoglobin A1c 8.2. Resume metformin, Januvia, Prandin on discharge. 13. COPD, stopped smoking 1989. 14. OSA, BiPAP recommended in past by report, not on because of finances.   Appears clinically stable. Finish his IV ceftriaxone and vancomycin today. Change to Ceftin 4/20.  Wean oxygen.  Lasix to assist with  diuresis.  Anticipate discharge 4/20.  Discussed with wife at bedside.  Pending studies:   Blood cultures no growth to date  Code Status: full code DVT prophylaxis: heparin Family Communication:  Disposition Plan: home when improved  Murray Hodgkins, MD  [facility]  Pager [phone] If 7PM-7AM, please contact night-coverage at [email or web address], password [facility] 04/21/2013, 12:48 PM  LOS: 5 days   Consultants:  Pulmonology  Procedures:  Intubation 4/14 >> 4/16  Central line 4/14 >>   2-D echocardiogram: Left ventricular ejection fraction 40-45%, wall motion abnormalities consistent with ischemic cardiomyopathy. Grade 2 diastolic dysfunction.  Antibiotics:  Zithromax 4/14 >> 4/18  Ceftriaxone 4/14 >> 4/19  Ceftin 4/20 >> 4/23  Vancomycin 4/14 >> 4/19  HPI/Subjective: Notes increased lower extremity edema otherwise doing well.  Objective: Filed Vitals:   04/21/13 0612 04/21/13 0657 04/21/13 0909 04/21/13 1057  BP: 146/65  138/75   Pulse: 91  88   Temp: 97.5 F (36.4 C)     TempSrc: Oral     Resp: 12     Height:      Weight:      SpO2: 99% 96%  93%    Intake/Output Summary (Last 24 hours) at 04/21/13 1248 Last data filed at 04/21/13 1113  Gross per 24 hour  Intake   1660 ml  Output   1050 ml  Net    610 ml     Filed Weights   04/18/13 0500 04/19/13 0500 04/20/13 0628  Weight: 120 kg (264 lb 8.8 oz) 119.2 kg (262 lb 12.6 oz) 115.9 kg (255 lb 8.2 oz)    Exam:   Afebrile, vital signs stable. Stable hypoxia.  Gen.  Appears calm, comfortable, sitting on the side of the bed.  Cardiovascular. Regular rate and rhythm. No murmur, rub or gallop. 2+ bilateral lower extremity edema.  Respiratory clear to auscultation bilaterally. No wheezes, rales or rhonchi. Normal respiratory effort.  Psychiatric. Grossly normal mood and affect. Speech fluent and appropriate.  Data  Reviewed:  +4.4 L since admission. Urine output .85 L.   Capillary blood  sugars stable.  Scheduled Meds: . antiseptic oral rinse  15 mL Mouth Rinse q12n4p  . antiseptic oral rinse  15 mL Mouth Rinse BID  . aspirin EC  325 mg Oral Daily  . azithromycin  500 mg Oral Daily  . cefTRIAXone (ROCEPHIN)  IV  1 g Intravenous Q24H  . heparin subcutaneous  5,000 Units Subcutaneous 3 times per day  . hydrochlorothiazide  12.5 mg Oral Daily  . insulin aspart  0-15 Units Subcutaneous TID WC  . insulin aspart  0-5 Units Subcutaneous QHS  . ipratropium-albuterol  3 mL Nebulization QID  . metoprolol tartrate  100 mg Oral BID  . pantoprazole  40 mg Oral Daily  . simvastatin  10 mg Oral q1800  . sodium chloride  3 mL Intravenous Q12H  . vancomycin  1,000 mg Intravenous Q12H   Continuous Infusions:    Principal Problem:   Sepsis Active Problems:   DIABETES MELLITUS, WITH NEUROLOGICAL COMPLICATIONS   HYPERTENSION   Chest pain   CAD (coronary artery disease)   COPD (chronic obstructive pulmonary disease)   Chronic kidney disease   Hyperlipidemia   Obese   CAP (community acquired pneumonia)   Acute on chronic renal failure   Acute respiratory failure   Acute encephalopathy   Time spent 20 minutes

## 2013-04-21 NOTE — Progress Notes (Signed)
PHARMACIST - PHYSICIAN COMMUNICATION DR:   TRH CONCERNING: Antibiotic IV to Oral Route Change Policy  RECOMMENDATION: This patient is receiving Zithromax by the intravenous route.  Based on criteria approved by the Pharmacy and Therapeutics Committee, the antibiotic(s) is/are being converted to the equivalent oral dose form(s).   DESCRIPTION: These criteria include:  Patient being treated for a respiratory tract infection, urinary tract infection, cellulitis or clostridium difficile associated diarrhea if on metronidazole  The patient is not neutropenic and does not exhibit a GI malabsorption state  The patient is eating (either orally or via tube) and/or has been taking other orally administered medications for a least 24 hours  The patient is improving clinically and has a Tmax < 100.5  If you have questions about this conversion, please contact the Pharmacy Department  [x]   [phone] )  [facility] []   [phone] )  [facility]  []   [phone] )  [facility] []   [phone] )  [facility]   The patient is receiving Protonix by the intravenous route.  Based on criteria approved by the Pharmacy and [place], the medication is being converted to the equivalent oral dose form.  These criteria include: -No Active GI bleeding -Able to tolerate diet of full liquids (or better) or tube feeding OR able to tolerate other medications by the oral or enteral route  If you have any questions about this conversion, please contact the Pharmacy Department (ext [phone]).  Thank you.  Pricilla Larsson, [facility] 04/21/2013 10:38 AM

## 2013-04-22 DIAGNOSIS — I5023 Acute on chronic systolic (congestive) heart failure: Secondary | ICD-10-CM

## 2013-04-22 DIAGNOSIS — I509 Heart failure, unspecified: Secondary | ICD-10-CM

## 2013-04-22 LAB — CULTURE, BLOOD (ROUTINE X 2)
Culture: NO GROWTH
Culture: NO GROWTH

## 2013-04-22 LAB — GLUCOSE, CAPILLARY
GLUCOSE-CAPILLARY: 336 mg/dL — AB (ref 70–99)
GLUCOSE-CAPILLARY: 353 mg/dL — AB (ref 70–99)
Glucose-Capillary: 275 mg/dL — ABNORMAL HIGH (ref 70–99)

## 2013-04-22 MED ORDER — ASPIRIN EC 81 MG PO TBEC
81.0000 mg | DELAYED_RELEASE_TABLET | Freq: Every day | ORAL | Status: DC
  Administered 2013-04-22: 81 mg via ORAL

## 2013-04-22 MED ORDER — FUROSEMIDE 20 MG PO TABS: 20.0000 mg | ORAL_TABLET | Freq: Every day | ORAL | Status: DC

## 2013-04-22 MED ORDER — CEFUROXIME AXETIL 500 MG PO TABS: 500.0000 mg | ORAL_TABLET | Freq: Two times a day (BID) | ORAL | Status: DC

## 2013-04-22 MED ORDER — IPRATROPIUM-ALBUTEROL 0.5-2.5 (3) MG/3ML IN SOLN: 3.0000 mL | Freq: Three times a day (TID) | RESPIRATORY_TRACT | Status: AC

## 2013-04-22 MED ORDER — IPRATROPIUM-ALBUTEROL 0.5-2.5 (3) MG/3ML IN SOLN: 3.0000 mL | Freq: Three times a day (TID) | RESPIRATORY_TRACT | Status: DC

## 2013-04-22 MED ORDER — FUROSEMIDE 20 MG PO TABS: 20.0000 mg | ORAL_TABLET | Freq: Every day | ORAL | Status: DC | PRN

## 2013-04-22 MED ORDER — ASPIRIN 81 MG PO TBEC
81.0000 mg | DELAYED_RELEASE_TABLET | Freq: Every day | ORAL | Status: DC
Start: 2013-04-22 — End: 2015-08-02

## 2013-04-22 MED ORDER — FUROSEMIDE 10 MG/ML IJ SOLN
40.0000 mg | Freq: Once | INTRAMUSCULAR | Status: AC
  Administered 2013-04-22: 40 mg via INTRAVENOUS
  Filled 2013-04-22: qty 4

## 2013-04-22 NOTE — Progress Notes (Signed)
UR chart review completed.  

## 2013-04-22 NOTE — Progress Notes (Signed)
Patient d/c'd with instructions and prescription.  Patient and his wife and demonstrated understanding via teach back.  Patient left the floor via w/c with staff and family instable condition.

## 2013-04-22 NOTE — Progress Notes (Signed)
Pt needs to se a Education officer, museum and need a O2 evaluation while ambuating, also can we start him on an albuterol MDI with a spacer

## 2013-04-22 NOTE — Progress Notes (Signed)
PROGRESS NOTE  AIMAR BORGHI QIH:[id number] DOB: [birth year] DOA: 04/16/2013 PCP: Everrett Coombe, NP [facility] Department   Summary: 63 year old man with history of CABG, diabetes, COPD presented to the emergency department 4/14 with worsening shortness of breath, generalized weakness, intermittent chest pain. Admitted for sepsis with acute respiratory failure, hypotension thought related to pneumonia.  Assessment/Plan: 1. Sepsis with acute hypoxic respiratory failure requiring intubation and mechanical ventilation, acute renal failure, hypotension, encephalopathy, respiratory acidosis. All issues resolved. Blood cultures no growth, final. 2. Acute hypoxic respiratory failure with acute respiratory acidosis. Stable status post extubation 4/16. Resolved. 3. CAP. Appears clinically resolved. Afebrile. Complete oral antibiotics. 4. Acute renal failure likely ATN from sepsis complicated by ACE inhibitor, hydrochlorothiazide. Resolved. Creatinine normal.  5. Acute encephalopathy secondary to acute respiratory acidosis. Resolved. 6. Anemia of critical illness. Stable. No evidence of bleeding. 7. Chest pain intermittent. No recurrence. Troponins negative. Followup echocardiogram demonstrated LVEF 40-45% with wall motion abnormalities consistent with ischemic cardiomyopathy. Followup with cardiology as an outpatient. 8. Right-sided flank pain. Resolved. Long-standing.  No hydronephrosis. Secondary to chronic nephrolithiasis. 9. LE edema. Stable. Acute on chronic likely secondary to volume resuscitation. 10. UTI. Escherichia coli. Treated. 11. History of CAD, CABG 2012 12. DM. Stable. Hemoglobin A1c 8.2. Resume metformin, Januvia, Prandin on discharge. 13. COPD, stopped smoking 1989. Stable. 14. OSA, BiPAP recommended in past by report, not on because of finances. Has CPAP at home ordered by the cardiologist. I have asked him to followup with his primary cardiologist for  recommendations for settings.    Continuing to improve. Hypoxia has resolved. Now on oral antibiotics. Plan for discharge home today. He has some lower extremity edema which is expected to improve back to baseline (has chronic LE edema) with Lasix as outlined by cardiology. No pulmonary symptoms.  Lasix 20mg  prn swelling/SOB  Consideration could be given al alternative therapy to metformin given systolic dysfunction but as the patient has been stable on this medication with long history of heart failure, deferred to the outpatient setting  Discussed with wife at bedside.  Murray Hodgkins, MD  [facility]  Pager [phone] If 7PM-7AM, please contact night-coverage at [email or web address], password [facility] 04/22/2013, 1:11 PM  LOS: 6 days   Consultants:  Pulmonology  Procedures:  Intubation 4/14 >> 4/16  Central line 4/14 >> 4/80  2-D echocardiogram: Left ventricular ejection fraction 40-45%, wall motion abnormalities consistent with ischemic cardiomyopathy. Grade 2 diastolic dysfunction.  Antibiotics:  Zithromax 4/14 >> 4/18  Ceftriaxone 4/14 >> 4/19  Ceftin 4/20 >> 4/23  Vancomycin 4/14 >> 4/19  HPI/Subjective: Overall he is feeling better. Still has some lower extremity edema but breathing well and no complaints. No pain.  Objective: Filed Vitals:   04/22/13 0736 04/22/13 0840 04/22/13 1109 04/22/13 1306  BP:      Pulse:    93  Temp:      TempSrc:      Resp:    16  Height:      Weight:      SpO2: 93% 92% 94% 93%    Intake/Output Summary (Last 24 hours) at 04/22/13 1311 Last data filed at 04/22/13 0935  Gross per 24 hour  Intake   1120 ml  Output   1625 ml  Net   -505 ml     Filed Weights   04/18/13 0500 04/19/13 0500 04/20/13 0628  Weight: 120 kg (264 lb 8.8 oz) 119.2 kg (262 lb 12.6 oz) 115.9 kg (255 lb 8.2 oz)    Exam:  Afebrile, vital signs stable. Hypoxia has resolved.  Gen. Appears calm and comfortable.   Cardiovascular. Regular rate and  rhythm. No murmur, rub or gallop.  Respiratory clear to auscultation bilaterally. No wheezes, rales or rhonchi. Normal respiratory effort.  Abdomen soft.   Bilateral lower extremity, 2+ pitting edema.  Data Reviewed:  2992 UOP, -I/O last 24 hours.   Capillary blood sugars stable.  Scheduled Meds: . antiseptic oral rinse  15 mL Mouth Rinse q12n4p  . antiseptic oral rinse  15 mL Mouth Rinse BID  . aspirin EC  81 mg Oral Daily  . cefUROXime  500 mg Oral BID WC  . furosemide  40 mg Intravenous Once  . heparin subcutaneous  5,000 Units Subcutaneous 3 times per day  . hydrochlorothiazide  12.5 mg Oral Daily  . insulin aspart  0-15 Units Subcutaneous TID WC  . insulin aspart  0-5 Units Subcutaneous QHS  . ipratropium-albuterol  3 mL Nebulization TID  . metoprolol tartrate  100 mg Oral BID  . pantoprazole  40 mg Oral Daily  . simvastatin  10 mg Oral q1800  . sodium chloride  3 mL Intravenous Q12H   Continuous Infusions:    Principal Problem:   Sepsis Active Problems:   DIABETES MELLITUS, WITH NEUROLOGICAL COMPLICATIONS   HYPERTENSION   Chest pain   CAD (coronary artery disease)   COPD (chronic obstructive pulmonary disease)   Chronic kidney disease   Hyperlipidemia   Obese   CAP (community acquired pneumonia)   Acute on chronic renal failure   Acute respiratory failure   Acute encephalopathy   Acute on chronic systolic CHF (congestive heart failure)

## 2013-04-22 NOTE — Care Management Note (Signed)
    Page 1 of 1   04/22/2013     11:55:54 AM CARE MANAGEMENT NOTE 04/22/2013  Patient:  Jerry Williams, Jerry Williams   Account Number:  [id number]  Date Initiated:  04/22/2013  Documentation initiated by:  Theophilus Kinds  Subjective/Objective Assessment:   Pt admitted from home with respiratory failure. Pt lives with his wife and will return home at discharge. Pt is independent with ADL's. Pt has neb machine and cpap for home use.     Action/Plan:   No CM needs noted. Pt does not qualify for home O2. Pt for discharge today.   Anticipated DC Date:  04/22/2013   Anticipated DC Plan:  [place]  CM consult      Choice offered to / List presented to:             Status of service:  Completed, signed off Medicare Important Message given?   (If response is "NO", the following Medicare IM given date fields will be blank) Date Medicare IM given:   Date Additional Medicare IM given:    Discharge Disposition:  HOME/SELF CARE  Per UR Regulation:    If discussed at Long Length of Stay Meetings, dates discussed:    Comments:  04/22/13 [place], RN BSN CM

## 2013-04-22 NOTE — Progress Notes (Signed)
O2 Saturation for assessment for home O2:  O2 sats on room air 94%.  O2 saturation on RA while ambulating/during exertion 90%.  Notified Tammy Blackwell CM of results.

## 2013-04-22 NOTE — Progress Notes (Signed)
Inpatient Diabetes Program Recommendations  AACE/ADA: New Consensus Statement on Inpatient Glycemic Control (2013)  Target Ranges:  Prepandial:   less than 140 mg/dL      Peak postprandial:   less than 180 mg/dL (1-2 hours)      Critically ill patients:  140 - 180 mg/dL   Results for MINER, KORAL (MRN [id number]) as of 04/22/2013 10:10  Ref. Range 04/21/2013 07:35 04/21/2013 11:12 04/21/2013 16:14 04/21/2013 21:14 04/22/2013 07:33  Glucose-Capillary Latest Range: 70-99 mg/dL 261 (H) 299 (H) 321 (H) 336 (H) 275 (H)   Diabetes history: DM2 Outpatient Diabetes medications: Janumet 50-1000 mg BID Current orders for Inpatient glycemic control: Novolog 0-15 units AC, Novolog 0-5 units HS  Inpatient Diabetes Program Recommendations Insulin - Basal: If patient is not discharged today, please consider ordering Levemir 15 units daily. Insulin - Meal Coverage: Please consider ordering Novolog 5 units TID with meals for meal coverage.  Thanks, Barnie Alderman, RN, MSN, CCRN Diabetes Coordinator Inpatient Diabetes Program [phone] (Team Pager) [phone] (AP office) [phone] [facility] office)

## 2013-04-22 NOTE — Progress Notes (Signed)
Patient ID: Jerry Williams, male   DOB: 1950/08/31, 63 y.o.   MRN: [id number]    Subjective:    No complaints this morning. Ambulating without significant symptoms.   Objective:   Temp:  [97.8 F (36.6 C)-98.3 F (36.8 C)] 98.3 F (36.8 C) (04/20 0500) Pulse Rate:  [92-95] 94 (04/20 0500) Resp:  [12-18] 15 (04/20 0500) BP: (118-150)/(64-88) 150/74 mmHg (04/20 0500) SpO2:  [93 %-98 %] 93 % (04/20 0736) Last BM Date: 04/21/13  Filed Weights   04/18/13 0500 04/19/13 0500 04/20/13 0628  Weight: 264 lb 8.8 oz (120 kg) 262 lb 12.6 oz (119.2 kg) 255 lb 8.2 oz (115.9 kg)    Intake/Output Summary (Last 24 hours) at 04/22/13 0956 Last data filed at 04/22/13 0935  Gross per 24 hour  Intake   1600 ml  Output   1975 ml  Net   -375 ml     Exam:  General: NAD  Resp: CTAB  Cardiac: RRR, no m/r/g, no JVD, no carotid bruits  FB:XUXYBFX soft, NT, ND  MSK: 2+ bilateral LE edema  Neuro: no focal deficits  Psych: appropriate affect  Lab Results:  Basic Metabolic Panel:  Recent Labs Lab 04/17/13 0259 04/18/13 0505 04/19/13 0759  NA 136* 136* 135*  K 4.3 4.1 3.9  CL 98 96 97  CO2 26 26 28   GLUCOSE 56* 220* 189*  BUN 77* 40* 16  CREATININE 2.80* 1.43* 0.99  CALCIUM 7.9* 8.1* 8.0*    Liver Function Tests:  Recent Labs Lab 04/17/13 0259 04/18/13 0505  AST 40* 31  ALT 17 16  ALKPHOS 110 123*  BILITOT 0.5 0.7  PROT 6.5 7.0  ALBUMIN 2.2* 2.3*    CBC:  Recent Labs Lab 04/16/13 1227 04/17/13 0259 04/18/13 0505  WBC 12.2* 7.9 8.0  HGB 12.2* 10.8* 11.6*  HCT 39.8 35.9* 39.1  MCV 74.8* 75.7* 76.1*  PLT 197 172 176    Cardiac Enzymes:  Recent Labs Lab 04/16/13 1227 04/16/13 2049 04/17/13 0259  TROPONINI <0.30 <0.30 <0.30    BNP:  Recent Labs  04/16/13 1224  PROBNP 8961.0*    Coagulation: No results found for this basename: INR,  in the last 168 hours  ECG:   Medications:   Scheduled Medications: . antiseptic oral rinse  15 mL Mouth  Rinse q12n4p  . antiseptic oral rinse  15 mL Mouth Rinse BID  . aspirin EC  325 mg Oral Daily  . cefUROXime  500 mg Oral BID WC  . heparin subcutaneous  5,000 Units Subcutaneous 3 times per day  . hydrochlorothiazide  12.5 mg Oral Daily  . insulin aspart  0-15 Units Subcutaneous TID WC  . insulin aspart  0-5 Units Subcutaneous QHS  . ipratropium-albuterol  3 mL Nebulization QID  . metoprolol tartrate  100 mg Oral BID  . pantoprazole  40 mg Oral Daily  . simvastatin  10 mg Oral q1800  . sodium chloride  3 mL Intravenous Q12H     Infusions:     PRN Medications:  acetaminophen, acetaminophen, albuterol, ondansetron (ZOFRAN) IV, ondansetron     Assessment/Plan    63 yo  Male hx of HTN, DM, HL, CKD, COPD, CAD with CABG in 2012, ischemic CM LVEF 30-35% admitted with CAP and sepsis.   1. CAP - abx per primary team, patient initially with resp failure requiring intubation.   2. Chronic systolic heart failure - repeat echo 04/17/13 LVEF 40-45%, multiple WMAs, grade II diastolic dysfunction, PASP 56.  -  significant AKI in setting of sepsis now resolved, resume ACE and diuretic.  - does have significant LE edema likely from fluid resuscitation and AKI, denies significant DOE with ambulation. Recommend continuing daily HCTZ and starting prn lasix 20mg  daily at discharge - will defer systolic heart failure medication regimen to his primary cardiologist  3. CAD - no evidence of ACS this admission - continue medical therapy, change ASA to 81mg  daily for secondary prevention   Per primary team notes likely discharge today. No further inpatient cardiac testing or interventions planned, defer medical management of his chronic heart disease to his primary cardiologist. Resume ACE and HCTZ now that AKI resolved, recommend adding lasix 20mg  prn swelling/SOB to home regimen. Will sign off of inpatient care.      Carlyle Dolly, M.D., F.A.C.C.

## 2013-04-22 NOTE — Discharge Summary (Signed)
Physician Discharge Summary  Jerry Williams [id number] DOB: 02/27/1950 DOA: 04/16/2013  PCP: Everrett Coombe, NP  Admit date: 04/16/2013 Discharge date: 04/22/2013  Recommendations for Outpatient Follow-up:  1. Resolution of pneumonia 2. Anemia of critical illness, follow-up CBC as clinically indicated. 3. Suggest followup BMP 1 week  4. Chronic systolic congestive heart failure. Patient instructed to weigh self daily, follow daily weights, Lasix PRN. 5. Followup obstructive sleep apnea, consider CPAP as the patient cannot afford BiPAP.  6. Consideration could be given alternative therapy to metformin given systolic dysfunction but as the patient has been stable on this medication with long history of heart failure, deferred to the outpatient setting   Follow-up Information   Follow up with [facility] In 2 weeks.   Specialty:  Cardiology   Contact information:   [place] [address] [phone]       Follow up with Everrett Coombe, NP. Schedule an appointment as soon as possible for a visit in 1 week.   Specialty:  Family Medicine   Contact information:   [place] [address] [phone]      Discharge Diagnoses:  1. Sepsis secondary to community acquired pneumonia 2. Community acquired pneumonia 3. Acute hypoxic respiratory failure with acute respiratory acidosis 4. Acute renal failure 5. Acute encephalopathy, toxic 6. Anemia of critical illness 7. Chronic bilateral lower extremity edema 8. Chronic systolic congestive heart failure 9. UTI Escherichia coli 10. Diabetes mellitus type 2 11. COPD 56. OSA, BiPAP  Discharge Condition: improved Disposition: home  Diet recommendation: Heart healthy diabetic diet  Filed Weights   04/18/13 0500 04/19/13 0500 04/20/13 0628  Weight: 120 kg (264 lb 8.8 oz) 119.2 kg (262 lb 12.6 oz) 115.9 kg (255 lb 8.2 oz)    History of present illness:    63 year old man with history of CABG, diabetes, COPD presented to the emergency department 4/14 with worsening shortness of breath, generalized weakness, intermittent chest pain. Admitted for sepsis with acute respiratory failure, hypotension thought related to pneumonia.  Hospital Course:  Patient required intubation with mechanical ventilation and vasopressors for severe sepsis with multiorgan dysfunction. His condition gradually improved with empiric antibiotics, he was successfully weaned from the ventilator, vasopressors discontinued and respiratory status stabilized. Hypoxia resolved, acute renal failure resolved, encephalopathy resolved. He has had no fever or leukocytosis for some time now. He is now stable for discharge. He has a history of chronic systolic congestive heart failure, compared to echocardiogram on file in via care everywhere 08/2010, his ejection fraction is somewhat improved. He has chronic lower extremity edema but no pulmonary symptoms at this time. Cardiology saw in consultation for his heart failure and has recommended Lasix as needed as an outpatient. Individual issues as below.  1. Sepsis with acute hypoxic respiratory failure requiring intubation and mechanical ventilation, acute renal failure, hypotension, encephalopathy, respiratory acidosis. All issues resolved. Blood cultures no growth, final. 2. Acute hypoxic respiratory failure with acute respiratory acidosis. Stable status post extubation 4/16. Resolved. 3. CAP. Appears clinically resolved. Afebrile. Complete oral antibiotics. 4. Acute renal failure likely ATN from sepsis complicated by ACE inhibitor, hydrochlorothiazide. Resolved. Creatinine normal.  5. Acute encephalopathy secondary to acute respiratory acidosis. Resolved. 6. Anemia of critical illness. Stable. No evidence of bleeding. 7. Chest pain intermittent. No recurrence. Troponins negative. Followup echocardiogram demonstrated LVEF 40-45% with wall motion  abnormalities consistent with ischemic cardiomyopathy. Followup with cardiology as an outpatient. 8. Right-sided flank pain. Resolved. Long-standing. No  hydronephrosis. Secondary to chronic nephrolithiasis. 9. LE edema. Stable. Acute on chronic likely secondary to volume resuscitation. 10. UTI. Escherichia coli. Treated. 11. History of CAD, CABG 2012 12. DM. Stable. Hemoglobin A1c 8.2. Resume metformin, Januvia, Prandin on discharge. 13. COPD, stopped smoking 1989. Stable. 14. OSA, BiPAP recommended in past by report, not on because of finances. Has CPAP at home ordered by the cardiologist. I have asked him to followup with his primary cardiologist for recommendations for settings.  Continuing to improve. Hypoxia has resolved. Now on oral antibiotics. Plan for discharge home today. He has some lower extremity edema which is expected to improve back to baseline (has chronic LE edema) with Lasix as outlined by cardiology. No pulmonary symptoms.  Lasix 20mg  prn swelling/SOB   Consultants:  Pulmonology Procedures:  Intubation 4/14 >> 4/16  Central line 4/14 >> 4/80  2-D echocardiogram: Left ventricular ejection fraction 40-45%, wall motion abnormalities consistent with ischemic cardiomyopathy. Grade 2 diastolic dysfunction. Antibiotics:  Zithromax 4/14 >> 4/18  Ceftriaxone 4/14 >> 4/19  Ceftin 4/20 >> 4/23  Vancomycin 4/14 >> 4/19  Discharge Instructions  Discharge Orders   Future Orders Complete By Expires   ([place]) Call MD:  Anytime you have any of the following symptoms: 1) 3 pound weight gain in 24 hours or 5 pounds in 1 week 2) shortness of breath, with or without a dry hacking cough 3) swelling in the hands, feet or stomach 4) if you have to sleep on extra pillows at night in order to breathe.  As directed    Diet - low sodium heart healthy  As directed    Diet Carb Modified  As directed    Discharge instructions  As directed    Increase activity slowly  As directed         Medication List         albuterol (2.5 MG/3ML) 0.083% nebulizer solution  Commonly known as:  PROVENTIL  Take 2.5 mg by nebulization every 6 (six) hours as needed for wheezing or shortness of breath.     ALOE VERA PO  Take 1 capsule by mouth 2 (two) times daily.     aspirin 81 MG EC tablet  Take 1 tablet (81 mg total) by mouth daily.     benazepril-hydrochlorthiazide 10-12.5 MG per tablet  Commonly known as:  LOTENSIN HCT  Take 1 tablet by mouth daily.     cefUROXime 500 MG tablet  Commonly known as:  CEFTIN  Take 1 tablet (500 mg total) by mouth 2 (two) times daily with a meal.     dicyclomine 20 MG tablet  Commonly known as:  BENTYL  Take 20 mg by mouth 3 (three) times daily as needed for spasms. For stomach     esomeprazole 40 MG capsule  Commonly known as:  NEXIUM  Take 40 mg by mouth daily with supper. At 6pm     furosemide 20 MG tablet  Commonly known as:  LASIX  Take 1 tablet (20 mg total) by mouth daily as needed for fluid. Weigh your self daily. Take one tablet daily as needed for increased swelling.  Start taking on:  04/23/2013     ipratropium-albuterol 0.5-2.5 (3) MG/3ML Soln  Commonly known as:  DUONEB  Take 3 mLs by nebulization 3 (three) times daily.     lovastatin 20 MG tablet  Commonly known as:  MEVACOR  Take 20 mg by mouth at bedtime.     Magnesium 500 MG Tabs  Take  500 mg by mouth 2 (two) times daily.     metoprolol 50 MG tablet  Commonly known as:  LOPRESSOR  Take 75 mg by mouth 2 (two) times daily. **Take 175mg  twice a day take with 100mg   For a total of 175mg **     metoprolol 100 MG tablet  Commonly known as:  LOPRESSOR  Take 100 mg by mouth 2 (two) times daily. **Take 175mg  twice a day takes with 50mg  for a total 175mg **     OVER THE COUNTER MEDICATION  Take 1 tablet by mouth 3 (three) times daily with meals. "Dietary Supplement-Key 1-Raspberry flavored"     polycarbophil 625 MG tablet  Commonly known as:  FIBERCON  Take 1,250  mg by mouth daily with supper. At 6pm     repaglinide 2 MG tablet  Commonly known as:  PRANDIN  Take 4 mg by mouth 3 (three) times daily.     sitaGLIPtin-metformin 50-1000 MG per tablet  Commonly known as:  JANUMET  Take 1 tablet by mouth 2 (two) times daily with a meal.       No Known Allergies  The results of significant diagnostics from this hospitalization (including imaging, microbiology, ancillary and laboratory) are listed below for reference.    Significant Diagnostic Studies: Dg Chest 2 View  04/16/2013   CLINICAL DATA:  Shortness of breath and chest pain  EXAM: CHEST  2 VIEW  COMPARISON:  Study obtained earlier in the day  FINDINGS: There is patchy consolidation in the right lower lobe. There is atelectatic change in the lingula. There is a small right pleural effusion. Elsewhere lungs are clear. Heart is upper normal in size with normal pulmonary vascularity. No adenopathy. Patient is status post coronary artery bypass grafting.  IMPRESSION: Patchy right lower lobe infiltrate. Lingular atelectatic change. Small right pleural effusion.   Electronically Signed   By: Lowella Grip M.D.   On: 04/16/2013 13:03   US Renal  04/17/2013   CLINICAL DATA:  acute renal failure  EXAM: RENAL/URINARY TRACT ULTRASOUND COMPLETE  COMPARISON:  DG ABD ACUTE W/CHEST dated 04/16/2013; CT ABD/PELVIS W CM dated 02/11/2011  FINDINGS: Right Kidney:  Length: 15.9 cm. Echogenicity within normal limits. Multiple echogenic foci in the medullary portion of the kidney with acoustic shadowing. The largest area measures 4.3 cm in longitudinal dimensions. A sinus consistent with nonobstructing medullary calculi. There is evidence hydronephrosis nor masses.  Left Kidney:  Length: 16 cm. Echogenicity within normal limits. Small punctate echogenic foci is appreciated within the medullary portion of the kidney consistent with nonobstructing calculi. There is no evidence of hydronephrosis nor masses.  Bladder:   Decompressed secondary to Foley catheter placement.  IMPRESSION: Bilateral nonobstructing medullary calculi largest on the right. No further sonographic abnormalities.   Electronically Signed   By: Margaree Mackintosh M.D.   On: 04/17/2013 13:44   Dg Abd Acute W/chest  04/16/2013   CLINICAL DATA:  low back pain  Is  EXAM: ACUTE ABDOMEN SERIES (ABDOMEN 2 VIEW & CHEST 1 VIEW)  COMPARISON:  DG CHEST 1V PORT dated 12/11/2011  FINDINGS: There is no evidence of dilated bowel loops or free intraperitoneal air. No radiopaque calculi or other significant radiographic abnormality is seen.  Cardiac silhouette is enlarged, patient is status post median sternotomy. There is increased density within the lung bases, right greater than left with blunting of the right costophrenic angle.  IMPRESSION: Negative abdominal radiographs.  Atelectasis versus infiltrate within the lung bases right greater than left as well  small right effusion. Further evaluation with dedicated two view chest radiograph recommended  Cardiomegaly   Electronically Signed   By: Margaree Mackintosh M.D.   On: 04/16/2013 11:10    Microbiology: Recent Results (from the past 240 hour(s))  URINE CULTURE     Status: None   Collection Time    04/16/13 12:40 PM      Result Value Ref Range Status   Specimen Description URINE, CLEAN CATCH   Final   Special Requests NONE   Final   Culture  Setup Time     Final   Value: 04/16/2013 22:55     Performed at [place]     Final   Value: 20,OOO COLONIES/ML     Performed at [facility]   Culture     Final   Value: ESCHERICHIA COLI     Performed at [facility]   Report Status 04/18/2013 FINAL   Final   Organism ID, Bacteria ESCHERICHIA COLI   Final  MRSA PCR SCREENING     Status: None   Collection Time    04/16/13  4:20 PM      Result Value Ref Range Status   MRSA by PCR NEGATIVE  NEGATIVE Final   Comment:            The GeneXpert MRSA Assay (FDA     approved for  NASAL specimens     only), is one component of a     comprehensive MRSA colonization     surveillance program. It is not     intended to diagnose MRSA     infection nor to guide or     monitor treatment for     MRSA infections.  CULTURE, BLOOD (ROUTINE X 2)     Status: None   Collection Time    04/17/13  7:53 AM      Result Value Ref Range Status   Specimen Description BLOOD RIGHT ARM   Final   Special Requests BOTTLES DRAWN AEROBIC AND ANAEROBIC 8CC   Final   Culture NO GROWTH 5 DAYS   Final   Report Status 04/22/2013 FINAL   Final  CULTURE, BLOOD (ROUTINE X 2)     Status: None   Collection Time    04/17/13  7:53 AM      Result Value Ref Range Status   Specimen Description BLOOD LEFT ARM   Final   Special Requests BOTTLES DRAWN AEROBIC AND ANAEROBIC 8CC   Final   Culture NO GROWTH 5 DAYS   Final   Report Status 04/22/2013 FINAL   Final     Labs: Basic Metabolic Panel:  Recent Labs Lab 04/16/13 1227 04/17/13 0259 04/18/13 0505 04/19/13 0759  NA 135* 136* 136* 135*  K 4.4 4.3 4.1 3.9  CL 94* 98 96 97  CO2 26 26 26 28   GLUCOSE 70 56* 220* 189*  BUN 71* 77* 40* 16  CREATININE 2.63* 2.80* 1.43* 0.99  CALCIUM 9.0 7.9* 8.1* 8.0*   Liver Function Tests:  Recent Labs Lab 04/17/13 0259 04/18/13 0505  AST 40* 31  ALT 17 16  ALKPHOS 110 123*  BILITOT 0.5 0.7  PROT 6.5 7.0  ALBUMIN 2.2* 2.3*   CBC:  Recent Labs Lab 04/16/13 1227 04/17/13 0259 04/18/13 0505  WBC 12.2* 7.9 8.0  NEUTROABS 8.9*  --   --   HGB 12.2* 10.8* 11.6*  HCT 39.8 35.9* 39.1  MCV 74.8* 75.7* 76.1*  PLT  197 172 176   Cardiac Enzymes:  Recent Labs Lab 04/16/13 1227 04/16/13 2049 04/17/13 0259  TROPONINI <0.30 <0.30 <0.30     Recent Labs  04/16/13 1224  PROBNP 8961.0*   CBG:  Recent Labs Lab 04/21/13 1112 04/21/13 1614 04/21/13 2114 04/22/13 0733 04/22/13 1149  GLUCAP 299* 321* 336* 275* 353*    Principal Problem:   Sepsis Active Problems:   DIABETES MELLITUS,  WITH NEUROLOGICAL COMPLICATIONS   HYPERTENSION   Chest pain   CAD (coronary artery disease)   COPD (chronic obstructive pulmonary disease)   Chronic kidney disease   Hyperlipidemia   Obese   CAP (community acquired pneumonia)   Acute on chronic renal failure   Acute respiratory failure   Acute encephalopathy   Acute on chronic systolic CHF (congestive heart failure)   Time coordinating discharge: 35 minutes  Signed:  Murray Hodgkins, MD [facility] 04/22/2013, 1:34 PM

## 2013-05-04 ENCOUNTER — Telehealth: Payer: Self-pay | Admitting: Family Medicine

## 2013-05-04 NOTE — Telephone Encounter (Signed)
error 

## 2013-06-06 ENCOUNTER — Telehealth: Payer: Self-pay | Admitting: Family Medicine

## 2013-06-06 NOTE — Telephone Encounter (Signed)
Delete, Epic error.

## 2015-03-04 ENCOUNTER — Other Ambulatory Visit (HOSPITAL_COMMUNITY)
Admission: RE | Admit: 2015-03-04 | Discharge: 2015-03-04 | Disposition: A | Discharge status: Home / Self Care | Payer: BLUE CROSS/BLUE SHIELD | Source: Other Acute Inpatient Hospital | Attending: Hospitalist | Admitting: Hospitalist

## 2015-03-04 DIAGNOSIS — I5022 Chronic systolic (congestive) heart failure: Secondary | ICD-10-CM | POA: Diagnosis not present

## 2015-03-04 LAB — CBC WITH DIFFERENTIAL/PLATELET
BASOS ABS: 0 10*3/uL (ref 0.0–0.1)
BASOS PCT: 0 %
EOS ABS: 0.1 10*3/uL (ref 0.0–0.7)
EOS PCT: 0 %
HCT: 28.3 % — ABNORMAL LOW (ref 39.0–52.0)
Hemoglobin: 8.9 g/dL — ABNORMAL LOW (ref 13.0–17.0)
LYMPHS PCT: 9 %
Lymphs Abs: 1.3 10*3/uL (ref 0.7–4.0)
MCH: 25.4 pg — ABNORMAL LOW (ref 26.0–34.0)
MCHC: 31.4 g/dL (ref 30.0–36.0)
MCV: 80.6 fL (ref 78.0–100.0)
Monocytes Absolute: 0.7 10*3/uL (ref 0.1–1.0)
Monocytes Relative: 5 %
Neutro Abs: 12.8 10*3/uL — ABNORMAL HIGH (ref 1.7–7.7)
Neutrophils Relative %: 86 %
PLATELETS: 324 10*3/uL (ref 150–400)
RBC: 3.51 MIL/uL — AB (ref 4.22–5.81)
RDW: 15.3 % (ref 11.5–15.5)
WBC: 14.9 10*3/uL — ABNORMAL HIGH (ref 4.0–10.5)

## 2015-03-04 LAB — COMPREHENSIVE METABOLIC PANEL
ALBUMIN: 2.2 g/dL — AB (ref 3.5–5.0)
ALT: 8 U/L — AB (ref 17–63)
AST: 14 U/L — AB (ref 15–41)
Alkaline Phosphatase: 263 U/L — ABNORMAL HIGH (ref 38–126)
Anion gap: 11 (ref 5–15)
BUN: 43 mg/dL — AB (ref 6–20)
CHLORIDE: 95 mmol/L — AB (ref 101–111)
CO2: 20 mmol/L — AB (ref 22–32)
Calcium: 8.4 mg/dL — ABNORMAL LOW (ref 8.9–10.3)
Creatinine, Ser: 1.23 mg/dL (ref 0.61–1.24)
GFR calc Af Amer: >60 mL/min (ref >60)
GFR calc non Af Amer: >60 mL/min (ref >60)
Glucose, Bld: 127 mg/dL — ABNORMAL HIGH (ref 65–99)
Potassium: 5.6 mmol/L — ABNORMAL HIGH (ref 3.5–5.1)
SODIUM: 126 mmol/L — AB (ref 135–145)
Total Bilirubin: 0.2 mg/dL — ABNORMAL LOW (ref 0.3–1.2)
Total Protein: 6.4 g/dL — ABNORMAL LOW (ref 6.5–8.1)

## 2015-03-04 LAB — MAGNESIUM: Magnesium: 1.8 mg/dL (ref 1.7–2.4)

## 2015-04-12 IMAGING — CR DG CHEST 2V
2 series · 2 of 2 positions shown · non-contrast
Comparison: Study obtained earlier in the day

CLINICAL DATA: Shortness of breath and chest pain

EXAM:
CHEST  2 VIEW

[view not recorded (1 of 2)]
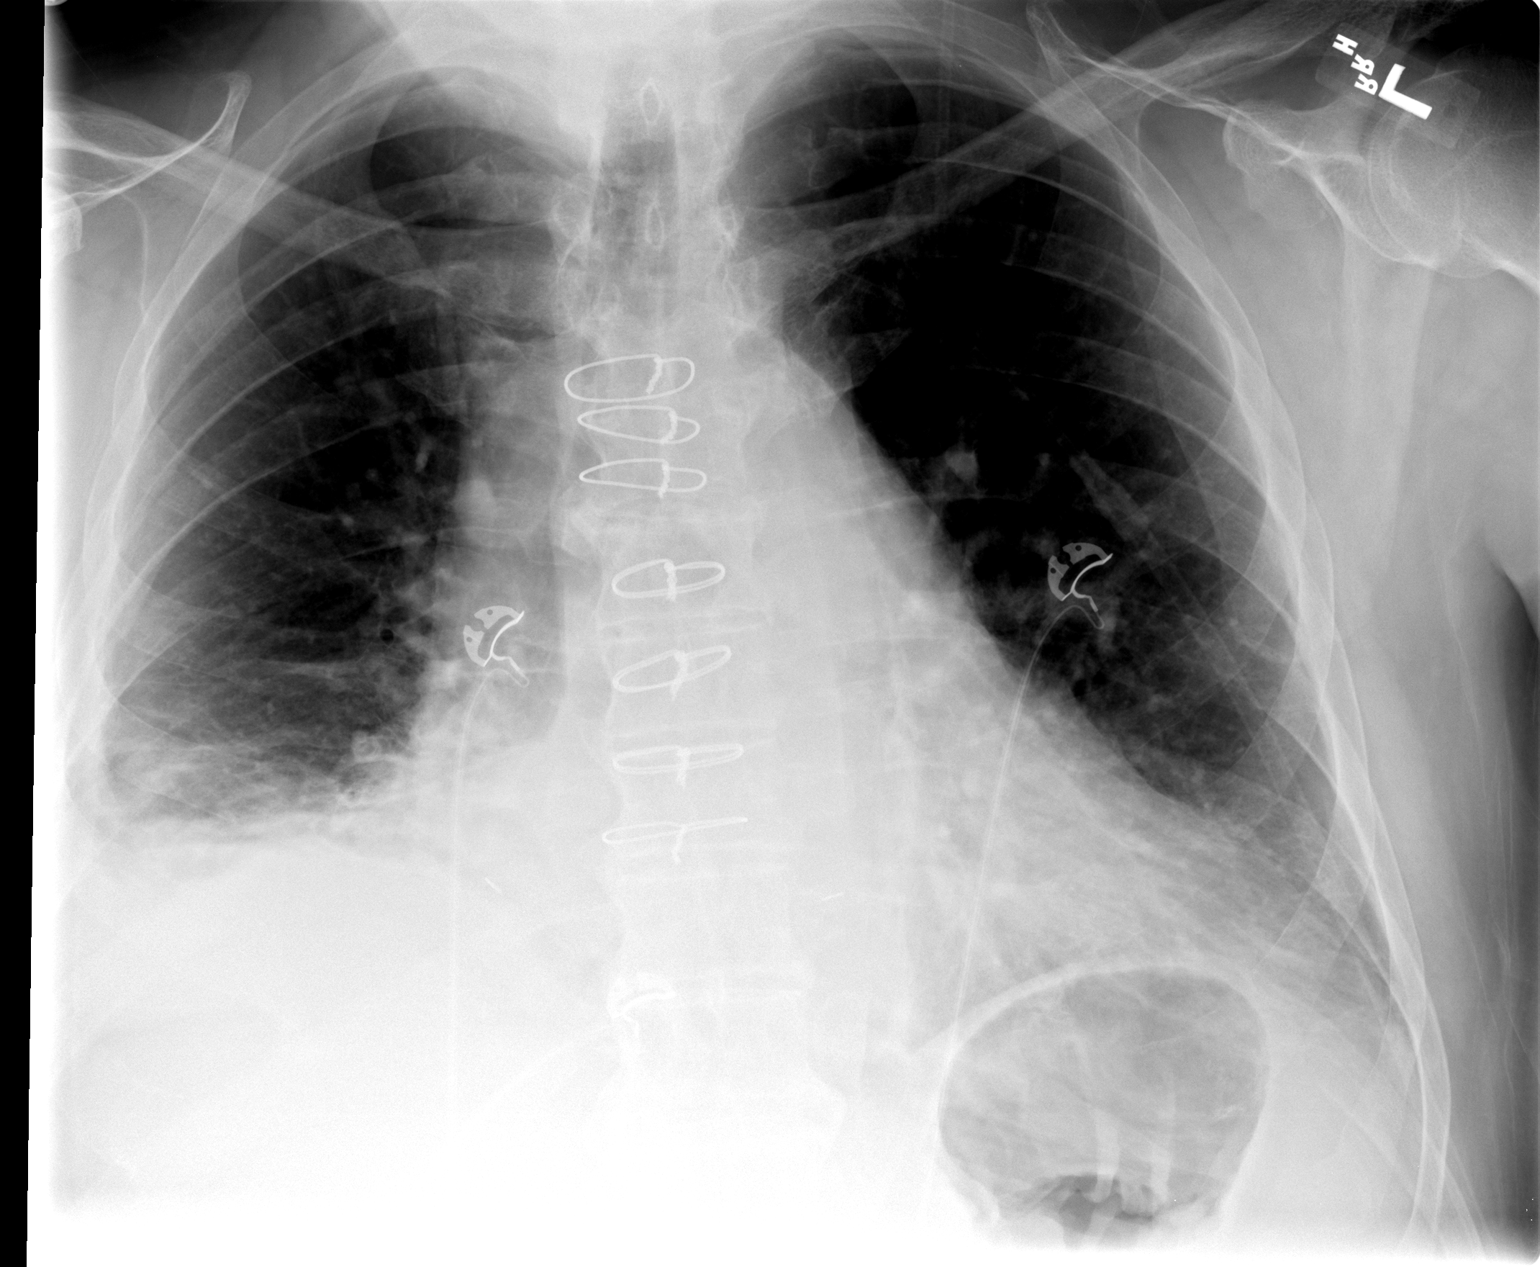

[view not recorded (2 of 2)]
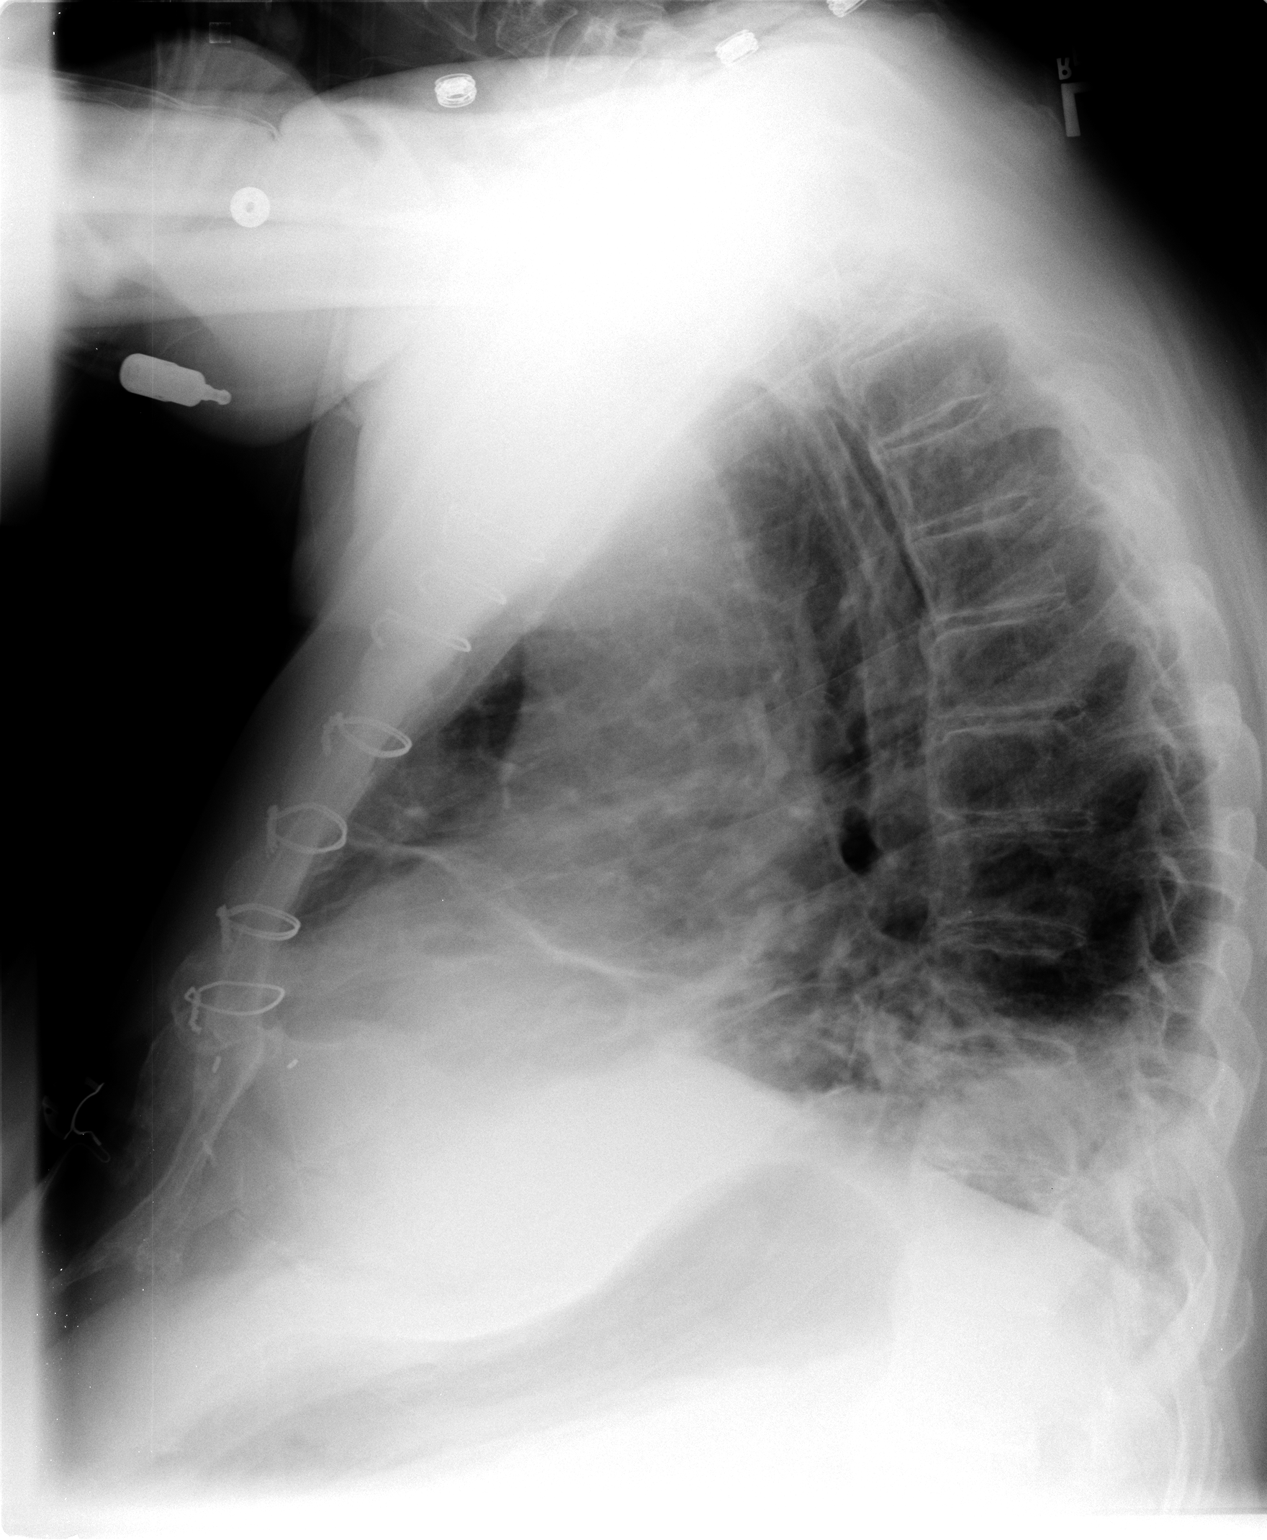

[2 of 2 positions shown; findings below may reference images not displayed]

FINDINGS: There is patchy consolidation in the right lower lobe. There is
atelectatic change in the lingula. There is a small right pleural
effusion. Elsewhere lungs are clear. Heart is upper normal in size
with normal pulmonary vascularity. No adenopathy. Patient is status
post coronary artery bypass grafting.
IMPRESSION: Patchy right lower lobe infiltrate. Lingular atelectatic change.
Small right pleural effusion.

## 2015-04-12 IMAGING — CR DG ABDOMEN ACUTE W/ 1V CHEST
5 series · 5 of 5 positions shown · non-contrast
Comparison: DG CHEST 1V PORT dated 12/11/2011

CLINICAL DATA: low back pain

Is
EXAM:
ACUTE ABDOMEN SERIES (ABDOMEN 2 VIEW & CHEST 1 VIEW)

[view not recorded (1 of 5)]
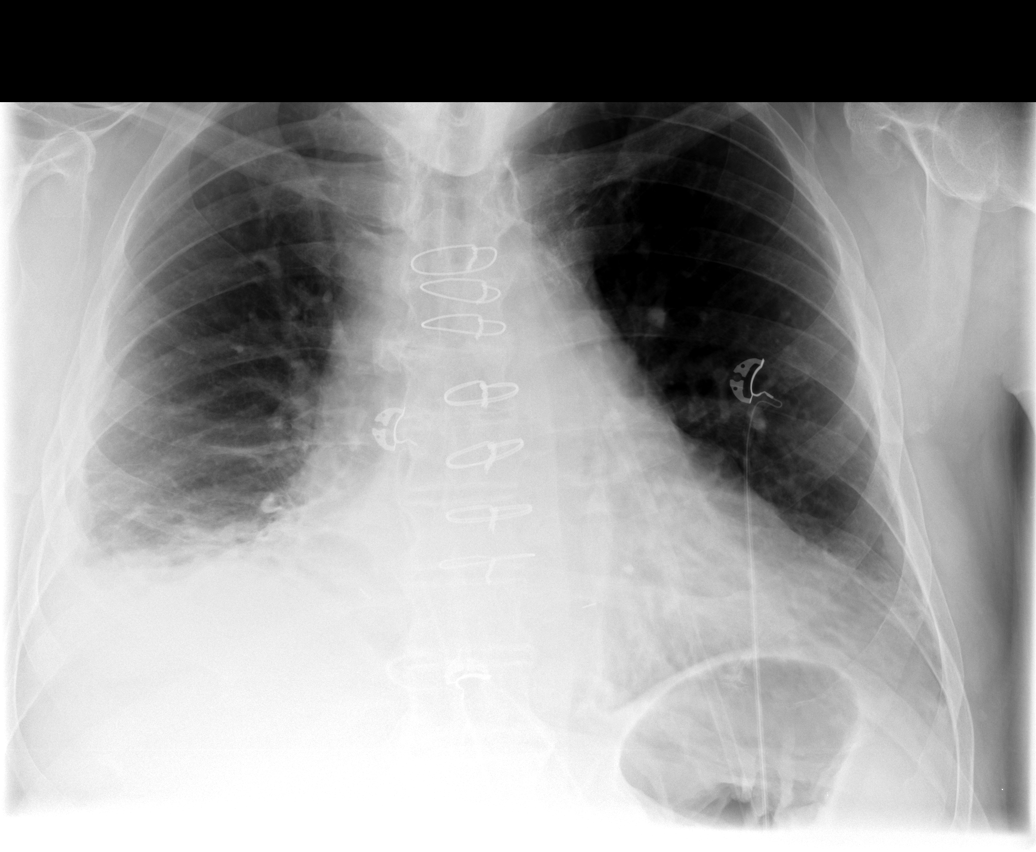

[view not recorded (2 of 5)]
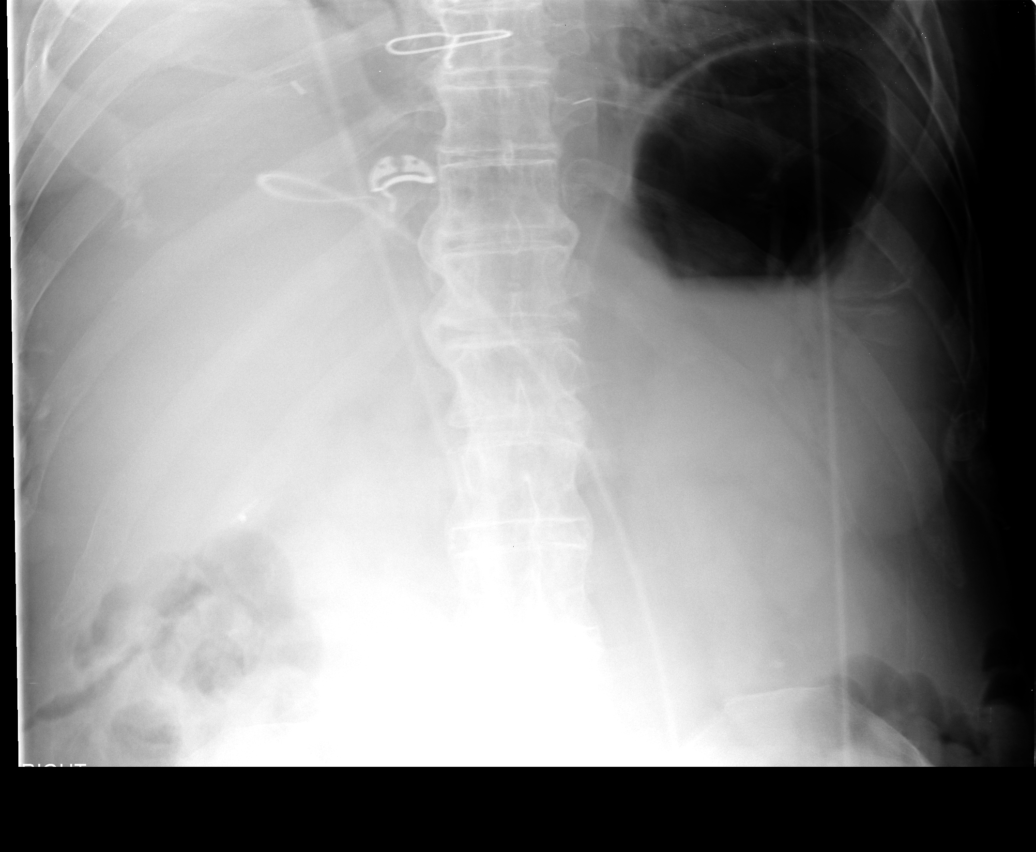

[view not recorded (3 of 5)]
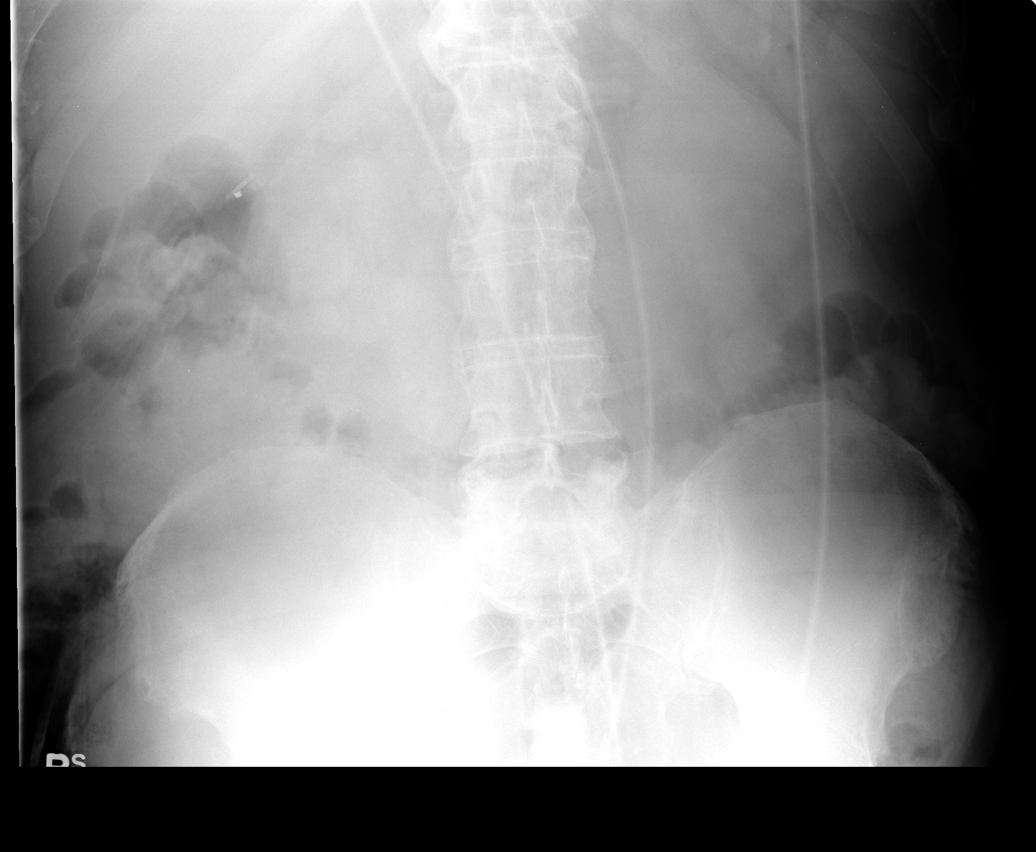

[view not recorded (4 of 5)]
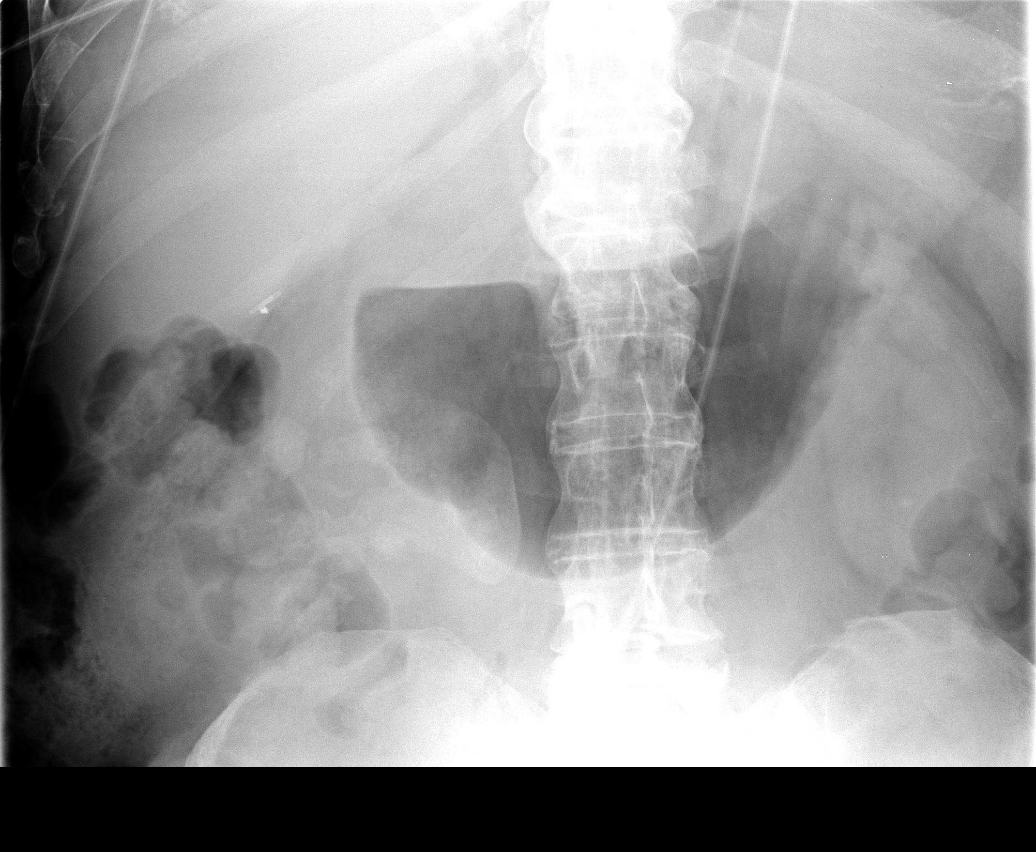

[view not recorded (5 of 5)]
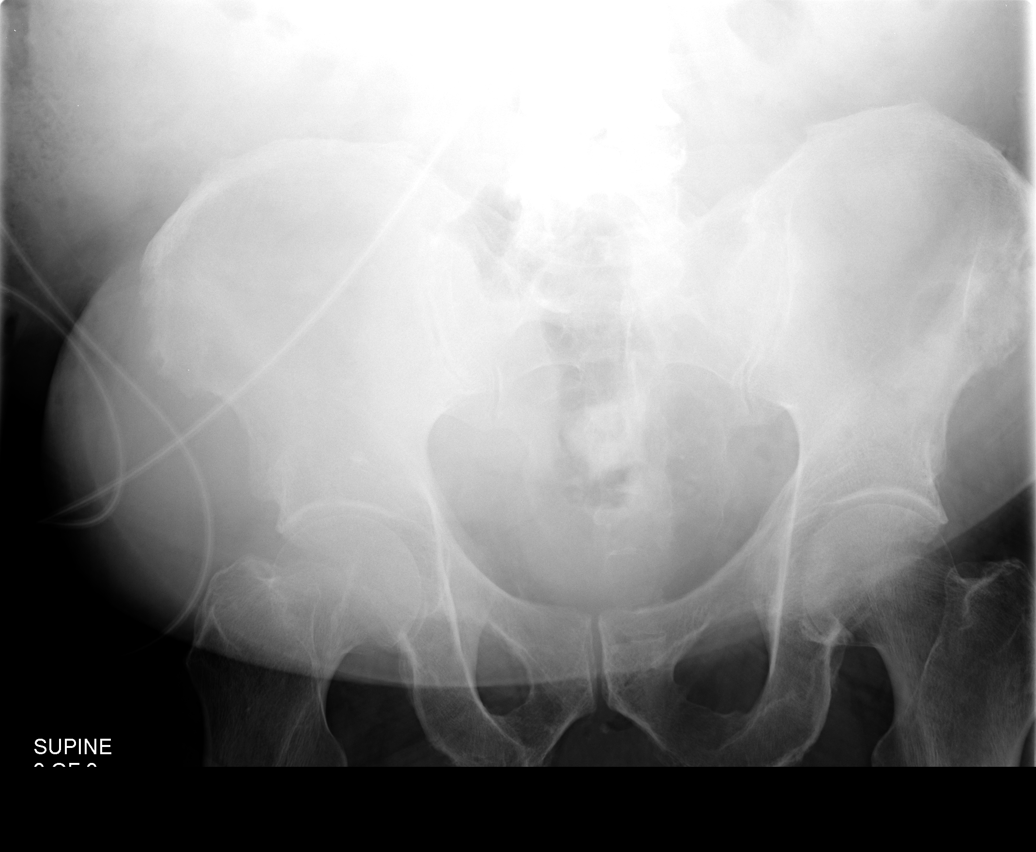

[5 of 5 positions shown; findings below may reference images not displayed]

FINDINGS: There is no evidence of dilated bowel loops or free intraperitoneal
air. No radiopaque calculi or other significant radiographic
abnormality is seen.

Cardiac silhouette is enlarged, patient is status post median
sternotomy. There is increased density within the lung bases, right
greater than left with blunting of the right costophrenic angle.
IMPRESSION: Negative abdominal radiographs.

Atelectasis versus infiltrate within the lung bases right greater
than left as well small right effusion. Further evaluation with
dedicated two view chest radiograph recommended

Cardiomegaly

## 2015-04-14 IMAGING — CR DG CHEST 1V PORT
1 series · 1 of 1 positions shown · non-contrast
Comparison: Portable exam 2400 hr compared to 04/17/2013

CLINICAL DATA: Respiratory failure, hypertension, diabetes, COPD

EXAM:
PORTABLE CHEST - 1 VIEW

[portable]
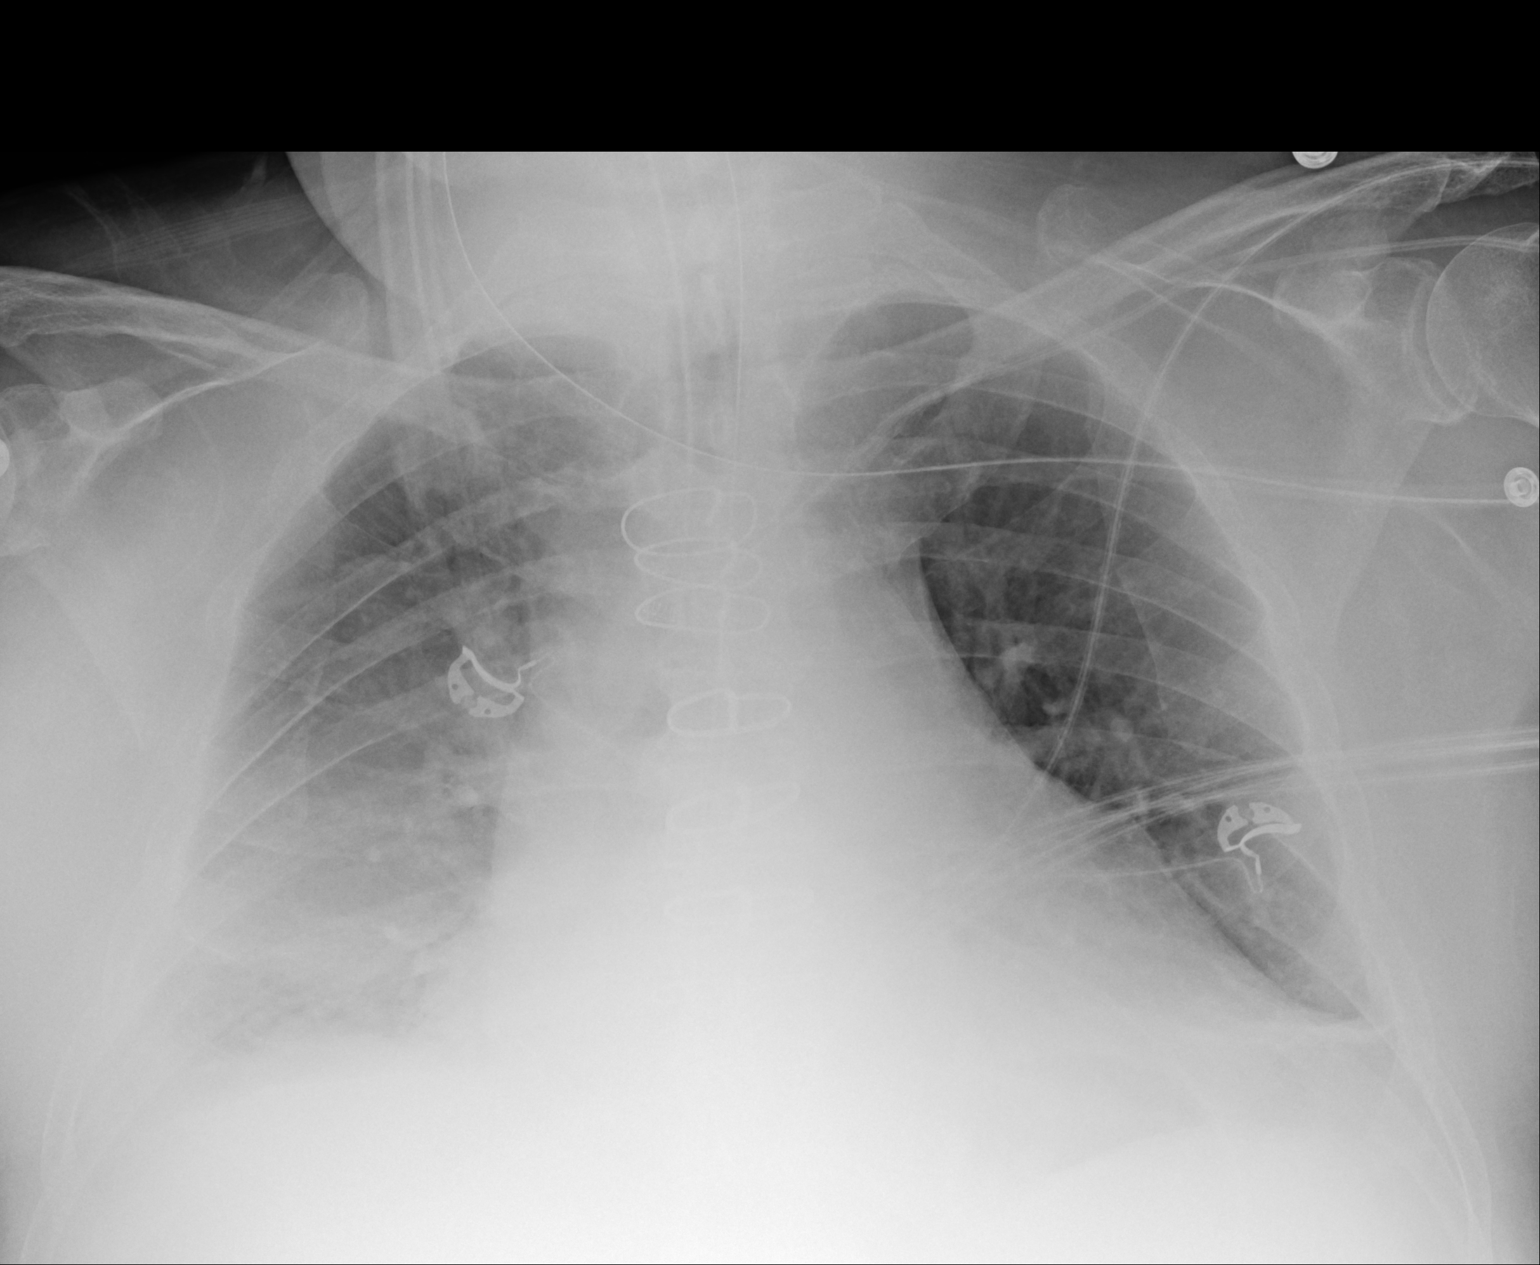

[1 of 1 positions shown; findings below may reference images not displayed]

FINDINGS: Tip of endotracheal tube projects approximately 5.2 cm above carina.

Nasogastric tube is seen into the mid thorax, tip not localized.

Left subclavian central venous catheter tip projects over left
brachiocephalic vein short of the SVC confluence.

Enlargement of cardiac silhouette with pulmonary vascular
congestion.

Bibasilar atelectasis and question right pleural effusion.

No pneumothorax.
IMPRESSION: Bibasilar atelectasis and small right pleural effusion.

Enlargement of cardiac silhouette with pulmonary vascular
congestion.

## 2015-04-15 IMAGING — CR DG CHEST 1V PORT
1 series · 1 of 1 positions shown · non-contrast
Comparison: 04/18/2013

CLINICAL DATA: Respiratory failure

EXAM:
PORTABLE CHEST - 1 VIEW

[portable]
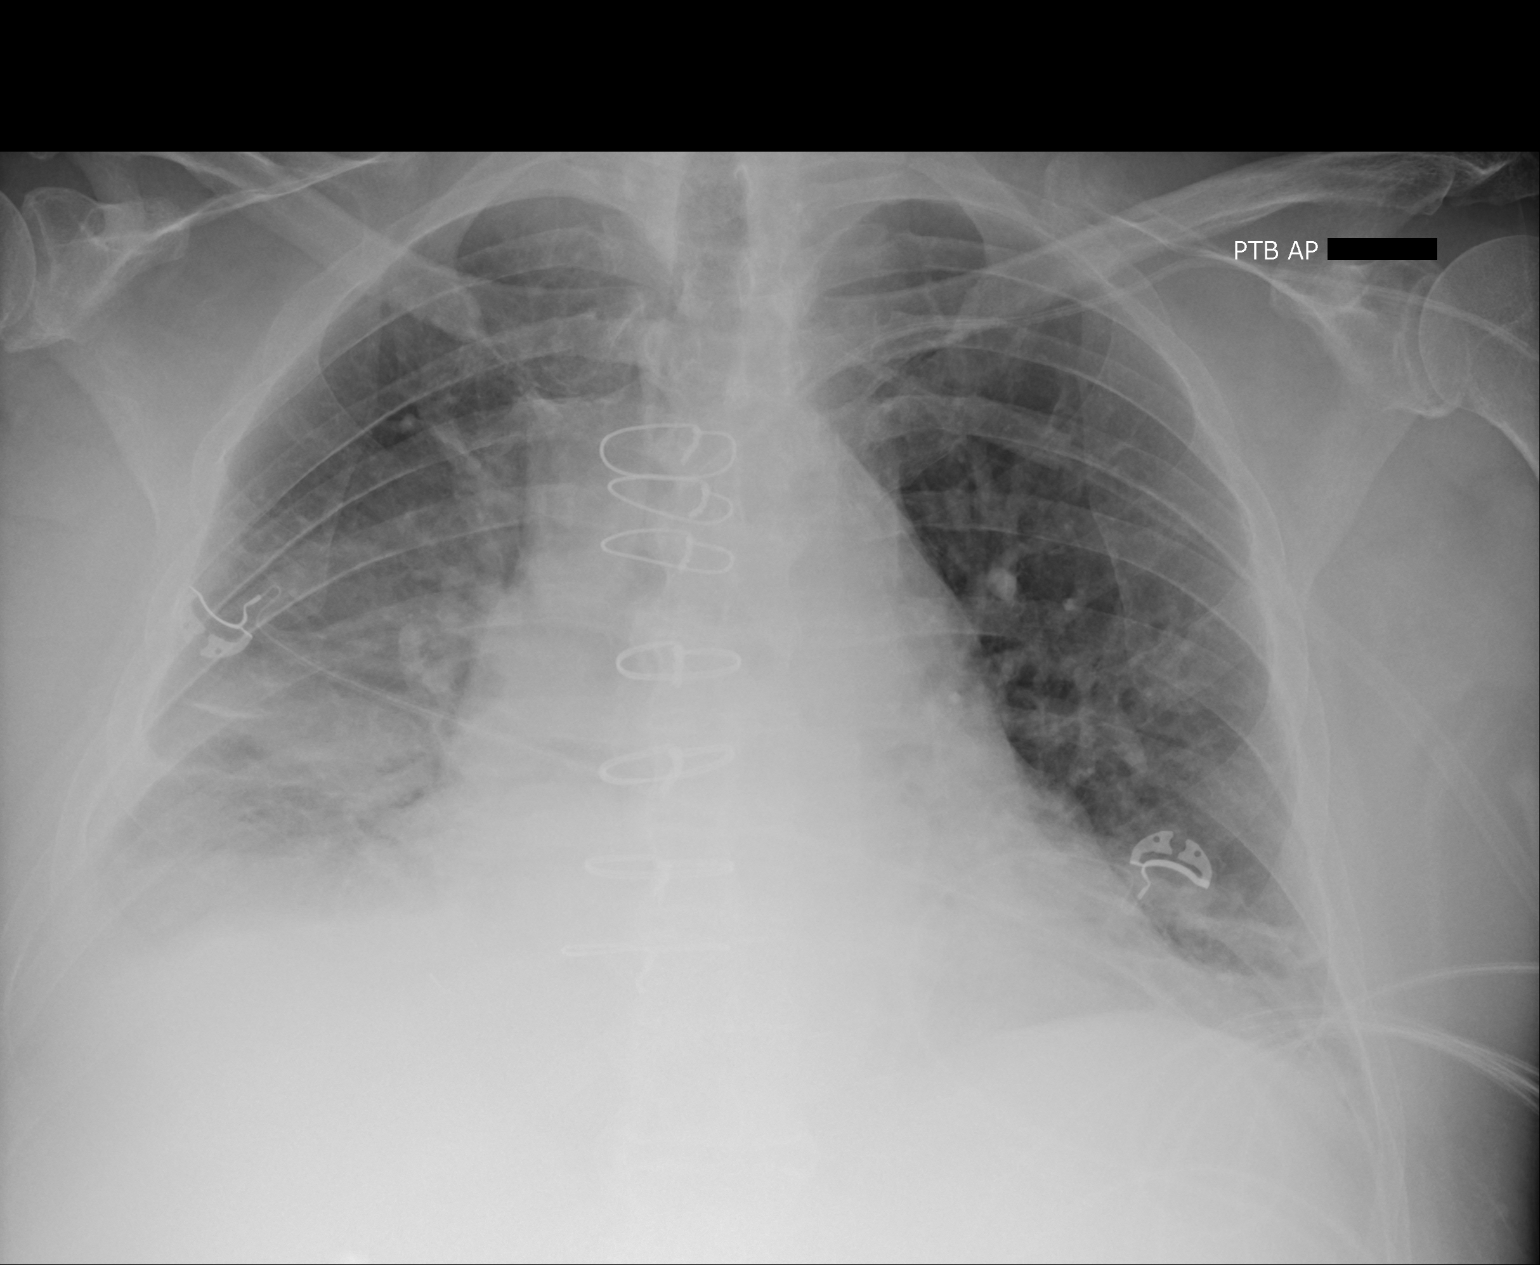

[1 of 1 positions shown; findings below may reference images not displayed]

FINDINGS: Cardiac shadow is stable. Postsurgical changes are again seen. The
endotracheal tube and nasogastric catheter been removed in the
interval. Persistent changes are noted in the bases bilaterally. No
new focal abnormality is seen. A left-sided subclavian line is again
seen at the proximal superior vena cava.
IMPRESSION: No significant change from the prior exam with the exception of
removal of endotracheal and nasogastric tubes.

## 2015-07-30 ENCOUNTER — Encounter (HOSPITAL_COMMUNITY): Payer: Self-pay | Admitting: Emergency Medicine

## 2015-07-30 ENCOUNTER — Inpatient Hospital Stay (HOSPITAL_COMMUNITY)
Admission: EM | Admit: 2015-07-30 | Discharge: 2015-08-02 | DRG: 433 | Disposition: A | Discharge status: Home / Self Care | Payer: BLUE CROSS/BLUE SHIELD | Attending: Internal Medicine | Admitting: Internal Medicine

## 2015-07-30 DIAGNOSIS — Z87891 Personal history of nicotine dependence: Secondary | ICD-10-CM

## 2015-07-30 DIAGNOSIS — E119 Type 2 diabetes mellitus without complications: Secondary | ICD-10-CM

## 2015-07-30 DIAGNOSIS — K2970 Gastritis, unspecified, without bleeding: Secondary | ICD-10-CM | POA: Diagnosis present

## 2015-07-30 DIAGNOSIS — Z7982 Long term (current) use of aspirin: Secondary | ICD-10-CM

## 2015-07-30 DIAGNOSIS — D72829 Elevated white blood cell count, unspecified: Secondary | ICD-10-CM | POA: Diagnosis present

## 2015-07-30 DIAGNOSIS — I10 Essential (primary) hypertension: Secondary | ICD-10-CM | POA: Diagnosis present

## 2015-07-30 DIAGNOSIS — D649 Anemia, unspecified: Secondary | ICD-10-CM | POA: Diagnosis present

## 2015-07-30 DIAGNOSIS — D473 Essential (hemorrhagic) thrombocythemia: Secondary | ICD-10-CM | POA: Diagnosis present

## 2015-07-30 DIAGNOSIS — K222 Esophageal obstruction: Secondary | ICD-10-CM | POA: Diagnosis present

## 2015-07-30 DIAGNOSIS — K7460 Unspecified cirrhosis of liver: Principal | ICD-10-CM | POA: Diagnosis present

## 2015-07-30 DIAGNOSIS — D75839 Thrombocytosis, unspecified: Secondary | ICD-10-CM | POA: Diagnosis present

## 2015-07-30 DIAGNOSIS — B9620 Unspecified Escherichia coli [E. coli] as the cause of diseases classified elsewhere: Secondary | ICD-10-CM | POA: Diagnosis present

## 2015-07-30 DIAGNOSIS — E1122 Type 2 diabetes mellitus with diabetic chronic kidney disease: Secondary | ICD-10-CM | POA: Diagnosis present

## 2015-07-30 DIAGNOSIS — Z7984 Long term (current) use of oral hypoglycemic drugs: Secondary | ICD-10-CM

## 2015-07-30 DIAGNOSIS — N183 Chronic kidney disease, stage 3 unspecified: Secondary | ICD-10-CM | POA: Diagnosis present

## 2015-07-30 DIAGNOSIS — Z951 Presence of aortocoronary bypass graft: Secondary | ICD-10-CM

## 2015-07-30 DIAGNOSIS — Z825 Family history of asthma and other chronic lower respiratory diseases: Secondary | ICD-10-CM

## 2015-07-30 DIAGNOSIS — Z86010 Personal history of colonic polyps: Secondary | ICD-10-CM

## 2015-07-30 DIAGNOSIS — R188 Other ascites: Secondary | ICD-10-CM | POA: Diagnosis not present

## 2015-07-30 DIAGNOSIS — I2510 Atherosclerotic heart disease of native coronary artery without angina pectoris: Secondary | ICD-10-CM | POA: Diagnosis not present

## 2015-07-30 DIAGNOSIS — D123 Benign neoplasm of transverse colon: Secondary | ICD-10-CM | POA: Diagnosis present

## 2015-07-30 DIAGNOSIS — E785 Hyperlipidemia, unspecified: Secondary | ICD-10-CM | POA: Diagnosis present

## 2015-07-30 DIAGNOSIS — K625 Hemorrhage of anus and rectum: Secondary | ICD-10-CM

## 2015-07-30 DIAGNOSIS — N390 Urinary tract infection, site not specified: Secondary | ICD-10-CM | POA: Diagnosis present

## 2015-07-30 DIAGNOSIS — R109 Unspecified abdominal pain: Secondary | ICD-10-CM | POA: Diagnosis not present

## 2015-07-30 DIAGNOSIS — J449 Chronic obstructive pulmonary disease, unspecified: Secondary | ICD-10-CM | POA: Diagnosis present

## 2015-07-30 DIAGNOSIS — I5042 Chronic combined systolic (congestive) and diastolic (congestive) heart failure: Secondary | ICD-10-CM | POA: Diagnosis present

## 2015-07-30 DIAGNOSIS — D7589 Other specified diseases of blood and blood-forming organs: Secondary | ICD-10-CM | POA: Diagnosis present

## 2015-07-30 DIAGNOSIS — D124 Benign neoplasm of descending colon: Secondary | ICD-10-CM | POA: Diagnosis present

## 2015-07-30 DIAGNOSIS — Z8249 Family history of ischemic heart disease and other diseases of the circulatory system: Secondary | ICD-10-CM

## 2015-07-30 DIAGNOSIS — E871 Hypo-osmolality and hyponatremia: Secondary | ICD-10-CM | POA: Diagnosis present

## 2015-07-30 DIAGNOSIS — D62 Acute posthemorrhagic anemia: Secondary | ICD-10-CM | POA: Diagnosis present

## 2015-07-30 DIAGNOSIS — I130 Hypertensive heart and chronic kidney disease with heart failure and stage 1 through stage 4 chronic kidney disease, or unspecified chronic kidney disease: Secondary | ICD-10-CM | POA: Diagnosis present

## 2015-07-30 DIAGNOSIS — K5790 Diverticulosis of intestine, part unspecified, without perforation or abscess without bleeding: Secondary | ICD-10-CM | POA: Diagnosis present

## 2015-07-30 DIAGNOSIS — K529 Noninfective gastroenteritis and colitis, unspecified: Secondary | ICD-10-CM | POA: Diagnosis present

## 2015-07-30 DIAGNOSIS — Z1623 Resistance to quinolones and fluoroquinolones: Secondary | ICD-10-CM | POA: Diagnosis present

## 2015-07-30 DIAGNOSIS — K449 Diaphragmatic hernia without obstruction or gangrene: Secondary | ICD-10-CM | POA: Diagnosis present

## 2015-07-30 DIAGNOSIS — G4733 Obstructive sleep apnea (adult) (pediatric): Secondary | ICD-10-CM | POA: Diagnosis present

## 2015-07-30 DIAGNOSIS — K922 Gastrointestinal hemorrhage, unspecified: Secondary | ICD-10-CM | POA: Diagnosis present

## 2015-07-30 DIAGNOSIS — K648 Other hemorrhoids: Secondary | ICD-10-CM | POA: Diagnosis present

## 2015-07-30 LAB — GLUCOSE, CAPILLARY: Glucose-Capillary: 124 mg/dL — ABNORMAL HIGH (ref 65–99)

## 2015-07-30 LAB — CBC WITH DIFFERENTIAL/PLATELET
Basophils Absolute: 0 10*3/uL (ref 0.0–0.1)
Basophils Relative: 0 %
Eosinophils Absolute: 0 10*3/uL (ref 0.0–0.7)
Eosinophils Relative: 0 %
HCT: 23.4 % — ABNORMAL LOW (ref 39.0–52.0)
Hemoglobin: 7.4 g/dL — ABNORMAL LOW (ref 13.0–17.0)
Lymphocytes Relative: 11 %
Lymphs Abs: 1.2 10*3/uL (ref 0.7–4.0)
MCH: 26 pg (ref 26.0–34.0)
MCHC: 31.6 g/dL (ref 30.0–36.0)
MCV: 82.1 fL (ref 78.0–100.0)
Monocytes Absolute: 1.1 10*3/uL — ABNORMAL HIGH (ref 0.1–1.0)
Monocytes Relative: 10 %
Neutro Abs: 9.2 10*3/uL — ABNORMAL HIGH (ref 1.7–7.7)
Neutrophils Relative %: 79 %
Platelets: 425 10*3/uL — ABNORMAL HIGH (ref 150–400)
RBC: 2.85 MIL/uL — ABNORMAL LOW (ref 4.22–5.81)
RDW: 17.2 % — ABNORMAL HIGH (ref 11.5–15.5)
WBC: 11.5 10*3/uL — ABNORMAL HIGH (ref 4.0–10.5)

## 2015-07-30 LAB — COMPREHENSIVE METABOLIC PANEL
ALK PHOS: 281 U/L — AB (ref 38–126)
ALT: 13 U/L — AB (ref 17–63)
AST: 41 U/L (ref 15–41)
Albumin: 2.1 g/dL — ABNORMAL LOW (ref 3.5–5.0)
Anion gap: 10 (ref 5–15)
BILIRUBIN TOTAL: 0.4 mg/dL (ref 0.3–1.2)
BUN: 61 mg/dL — ABNORMAL HIGH (ref 6–20)
CALCIUM: 10.2 mg/dL (ref 8.9–10.3)
CO2: 24 mmol/L (ref 22–32)
CREATININE: 1.4 mg/dL — AB (ref 0.61–1.24)
Chloride: 89 mmol/L — ABNORMAL LOW (ref 101–111)
GFR calc non Af Amer: 52 mL/min — ABNORMAL LOW (ref >60)
Glucose, Bld: 102 mg/dL — ABNORMAL HIGH (ref 65–99)
Potassium: 4.3 mmol/L (ref 3.5–5.1)
Sodium: 123 mmol/L — ABNORMAL LOW (ref 135–145)
TOTAL PROTEIN: 8.2 g/dL — AB (ref 6.5–8.1)

## 2015-07-30 LAB — PROTIME-INR
INR: 1.13
Prothrombin Time: 14.6 seconds (ref 11.4–15.2)

## 2015-07-30 LAB — CBG MONITORING, ED: Glucose-Capillary: 100 mg/dL — ABNORMAL HIGH (ref 65–99)

## 2015-07-30 LAB — POC OCCULT BLOOD, ED: Fecal Occult Bld: POSITIVE — AB

## 2015-07-30 MED ORDER — INSULIN ASPART 100 UNIT/ML ~~LOC~~ SOLN
0.0000 [IU] | Freq: Three times a day (TID) | SUBCUTANEOUS | Status: DC
  Administered 2015-07-31: 3 [IU] via SUBCUTANEOUS
  Administered 2015-07-31: 5 [IU] via SUBCUTANEOUS
  Administered 2015-07-31: 3 [IU] via SUBCUTANEOUS
  Administered 2015-08-01 (×2): 5 [IU] via SUBCUTANEOUS
  Administered 2015-08-02: 15 [IU] via SUBCUTANEOUS
  Administered 2015-08-02: 5 [IU] via SUBCUTANEOUS

## 2015-07-30 MED ORDER — HYDROCODONE-ACETAMINOPHEN 5-325 MG PO TABS
1.0000 | ORAL_TABLET | ORAL | Status: DC | PRN
  Administered 2015-07-31 (×2): 2 via ORAL
  Administered 2015-08-01: 1 via ORAL
  Filled 2015-07-30: qty 2
  Filled 2015-07-30: qty 1
  Filled 2015-07-30: qty 2

## 2015-07-30 MED ORDER — MAGNESIUM OXIDE 400 (241.3 MG) MG PO TABS
400.0000 mg | ORAL_TABLET | Freq: Two times a day (BID) | ORAL | Status: DC
  Administered 2015-07-31 – 2015-08-02 (×5): 400 mg via ORAL
  Filled 2015-07-30 (×6): qty 1

## 2015-07-30 MED ORDER — DICYCLOMINE HCL 10 MG PO CAPS: 20.0000 mg | ORAL_CAPSULE | Freq: Three times a day (TID) | ORAL | Status: DC | PRN

## 2015-07-30 MED ORDER — POLYETHYLENE GLYCOL 3350 17 G PO PACK: 17.0000 g | PACK | Freq: Every day | ORAL | Status: DC | PRN

## 2015-07-30 MED ORDER — OMEGA-3-ACID ETHYL ESTERS 1 G PO CAPS
1000.0000 mg | ORAL_CAPSULE | Freq: Every day | ORAL | Status: DC
  Administered 2015-07-31 – 2015-08-02 (×2): 1000 mg via ORAL
  Filled 2015-07-30 (×3): qty 1

## 2015-07-30 MED ORDER — SODIUM CHLORIDE 0.9 % IV SOLN: Freq: Once | INTRAVENOUS | Status: DC

## 2015-07-30 MED ORDER — ONDANSETRON HCL 4 MG PO TABS: 4.0000 mg | ORAL_TABLET | Freq: Four times a day (QID) | ORAL | Status: DC | PRN

## 2015-07-30 MED ORDER — ROPINIROLE HCL 1 MG PO TABS
1.0000 mg | ORAL_TABLET | Freq: Every day | ORAL | Status: DC
  Administered 2015-07-31 – 2015-08-01 (×3): 1 mg via ORAL
  Filled 2015-07-30 (×3): qty 1

## 2015-07-30 MED ORDER — DIGOXIN 125 MCG PO TABS
0.1250 mg | ORAL_TABLET | Freq: Every day | ORAL | Status: DC
  Administered 2015-07-31 – 2015-08-02 (×2): 0.125 mg via ORAL
  Filled 2015-07-30 (×3): qty 1

## 2015-07-30 MED ORDER — IPRATROPIUM-ALBUTEROL 0.5-2.5 (3) MG/3ML IN SOLN: 3.0000 mL | Freq: Three times a day (TID) | RESPIRATORY_TRACT | Status: DC

## 2015-07-30 MED ORDER — CIPROFLOXACIN HCL 250 MG PO TABS
500.0000 mg | ORAL_TABLET | Freq: Two times a day (BID) | ORAL | Status: DC
  Administered 2015-07-31 (×2): 500 mg via ORAL
  Filled 2015-07-30 (×2): qty 2

## 2015-07-30 MED ORDER — TORSEMIDE 20 MG PO TABS
60.0000 mg | ORAL_TABLET | Freq: Two times a day (BID) | ORAL | Status: DC
  Administered 2015-07-31 – 2015-08-02 (×4): 60 mg via ORAL
  Filled 2015-07-30 (×5): qty 3

## 2015-07-30 MED ORDER — LACTULOSE 10 GM/15ML PO SOLN: 10.0000 g | Freq: Three times a day (TID) | ORAL | Status: DC | PRN

## 2015-07-30 MED ORDER — PRAVASTATIN SODIUM 10 MG PO TABS
20.0000 mg | ORAL_TABLET | Freq: Every day | ORAL | Status: DC
  Administered 2015-07-31 – 2015-08-01 (×2): 20 mg via ORAL
  Filled 2015-07-30 (×2): qty 2
  Filled 2015-07-30 (×3): qty 1

## 2015-07-30 MED ORDER — NAPROXEN 250 MG PO TABS
500.0000 mg | ORAL_TABLET | Freq: Once | ORAL | Status: AC
  Administered 2015-07-30: 500 mg via ORAL
  Filled 2015-07-30: qty 2

## 2015-07-30 MED ORDER — BISACODYL 5 MG PO TBEC: 5.0000 mg | DELAYED_RELEASE_TABLET | Freq: Every day | ORAL | Status: DC | PRN

## 2015-07-30 MED ORDER — SODIUM CHLORIDE 0.9 % IV SOLN: 250.0000 mL | INTRAVENOUS | Status: DC | PRN

## 2015-07-30 MED ORDER — ALBUMIN HUMAN 25 % IV SOLN
50.0000 g | Freq: Once | INTRAVENOUS | Status: AC
Start: 2015-07-31 — End: 2015-07-31
  Administered 2015-07-31: 50 g via INTRAVENOUS
  Filled 2015-07-30: qty 200

## 2015-07-30 MED ORDER — ALBUTEROL SULFATE (2.5 MG/3ML) 0.083% IN NEBU: 2.5000 mg | INHALATION_SOLUTION | Freq: Four times a day (QID) | RESPIRATORY_TRACT | Status: DC | PRN

## 2015-07-30 MED ORDER — SPIRONOLACTONE 25 MG PO TABS
25.0000 mg | ORAL_TABLET | Freq: Every day | ORAL | Status: DC
Start: 2015-07-31 — End: 2015-08-02
  Administered 2015-07-31 – 2015-08-02 (×2): 25 mg via ORAL
  Filled 2015-07-30 (×3): qty 1

## 2015-07-30 MED ORDER — SODIUM CHLORIDE 0.9% FLUSH
3.0000 mL | Freq: Two times a day (BID) | INTRAVENOUS | Status: DC
  Administered 2015-07-31 – 2015-08-02 (×4): 3 mL via INTRAVENOUS

## 2015-07-30 MED ORDER — ONDANSETRON HCL 4 MG/2ML IJ SOLN: 4.0000 mg | Freq: Four times a day (QID) | INTRAMUSCULAR | Status: DC | PRN

## 2015-07-30 MED ORDER — FERROUS SULFATE 325 (65 FE) MG PO TABS
325.0000 mg | ORAL_TABLET | Freq: Two times a day (BID) | ORAL | Status: DC
  Administered 2015-07-31 (×2): 325 mg via ORAL
  Filled 2015-07-30 (×2): qty 1

## 2015-07-30 MED ORDER — GABAPENTIN 300 MG PO CAPS
300.0000 mg | ORAL_CAPSULE | Freq: Three times a day (TID) | ORAL | Status: DC
  Administered 2015-07-31 – 2015-08-02 (×7): 300 mg via ORAL
  Filled 2015-07-30 (×8): qty 1

## 2015-07-30 MED ORDER — ACETAMINOPHEN 650 MG RE SUPP: 650.0000 mg | Freq: Four times a day (QID) | RECTAL | Status: DC | PRN

## 2015-07-30 MED ORDER — SODIUM CHLORIDE 0.9% FLUSH
3.0000 mL | INTRAVENOUS | Status: DC | PRN
  Administered 2015-07-31 – 2015-08-02 (×2): 3 mL via INTRAVENOUS
  Filled 2015-07-30 (×2): qty 3

## 2015-07-30 MED ORDER — ACETAMINOPHEN 325 MG PO TABS: 650.0000 mg | ORAL_TABLET | Freq: Four times a day (QID) | ORAL | Status: DC | PRN

## 2015-07-30 NOTE — ED Triage Notes (Signed)
Pt went to health dept today for regular visit (this is pt's primary doctor).  NP kelly cobb called to notify nurse that pt was coming.  Pt having sob, dizziness, and when finger sticking pt today in office pt had hgb 6.8 and test was redone and 7.4 was the result.  Pt has been trending down from 9.7hgb in January to 7.1 in July.  Pt also has large ascites, pt unable to remember when it was last drained.  Pt has office visit with cardiologist on 08/27/15.  Pt appears pale today.

## 2015-07-30 NOTE — ED Provider Notes (Signed)
[facility] DEPT Provider Note   CSN: [id number] Arrival date & time: 07/30/15  1715  First Provider Contact:  First MD Initiated Contact with Patient 07/30/15 1754        History   Chief Complaint Chief Complaint  Patient presents with  . Anemia  . Ascites    HPI Jerry Williams is a 65 y.o. male.  He presents for evaluation of worsening hemoglobin level. He was told that his hemoglobin was 6.8. The last time it was checked 7.1, 2 weeks ago. He also is having worsening ascites. Apparently his PCP contacted his cardiologist, in [place], [place], and they recommended that he come to the ED for urgent evaluation. The patient is followed by both cardiology and gastroenterology for congestive heart failure and cirrhosis, which is nonspecific. He had large volume abdominal paracentesis, done 06/03/2015. He continues to be on diuretic medications. He denies fever, chills, nausea, vomiting, cough, shortness of breath or chest pain. He has been eating normally and having normal bowel movements. He has not had any trouble urinating. His weight is actually somewhat improved according to his wife who keeps daily notes about his vital signs. He is taking all of his medications as directed. He denies abdominal or back pain. There are no other known modifying factors.  HPI  Past Medical History:  Diagnosis Date  . Chronic kidney disease   . Colon polyp   . COPD (chronic obstructive pulmonary disease) ([place])   . Coronary artery disease 02/2010   Followed by Dr. Elsie Amis [facility]  . Diabetes mellitus   . Hyperlipidemia   . Hypertension   . IBS (irritable bowel syndrome)   . Obese   . OSA (obstructive sleep apnea) 01/2013   Needs BiPAP not afforded  . PONV (postoperative nausea and vomiting)   . PVC's (premature ventricular contractions)     Patient Active Problem List   Diagnosis Date Noted  . Acute on chronic systolic CHF (congestive heart failure) ([place]) 04/22/2013  .  Sepsis ([place]) 04/16/2013  . CAP (community acquired pneumonia) 04/16/2013  . Acute on chronic renal failure ([place]) 04/16/2013  . Acute respiratory failure ([place]) 04/16/2013  . Acute encephalopathy 04/16/2013  . COPD (chronic obstructive pulmonary disease) ([place])   . Chronic kidney disease   . Hyperlipidemia   . Obese   . Chest pain 12/11/2011  . CAD (coronary artery disease) 12/11/2011  . Frequent PVCs 12/11/2011  . Palpitations 11/10/2008  . COLONIC POLYPS 10/09/2008  . EXCESSIVE BELCHING 10/09/2008  . ABDOMINAL PAIN, UNSPECIFIED SITE 10/09/2008  . COLONIC POLYPS, ADENOMATOUS, HX OF 10/09/2008  . DIABETES MELLITUS, WITH NEUROLOGICAL COMPLICATIONS [id number]  . HYPERTENSION 09/04/2008    Past Surgical History:  Procedure Laterality Date  . CARDIAC CATHETERIZATION  09/2010   Dr. Elsie Amis [facility]  . CHOLECYSTECTOMY  02/14/2011   Procedure: LAPAROSCOPIC CHOLECYSTECTOMY;  Surgeon: Donato Heinz, MD;  Location: [facility];  Service: General;  Laterality: N/A;  . CORONARY ARTERY BYPASS GRAFT  09/2010   [facility]-Dr. Lunette Stands  . CYSTECTOMY     Scrotal abscess       Home Medications    Prior to Admission medications   Medication Sig Start Date End Date Taking? Authorizing Provider  aspirin 325 MG tablet Take 325 mg by mouth daily.   Yes Historical Provider, MD  ciprofloxacin (CIPRO) 500 MG tablet Take 500 mg by mouth 2 (two) times daily.   Yes Historical Provider, MD  dicyclomine (BENTYL) 20 MG tablet Take 20 mg by  mouth 3 (three) times daily as needed for spasms. For stomach   Yes Historical Provider, MD  digoxin (LANOXIN) 0.125 MG tablet Take 0.125 mg by mouth daily.   Yes Historical Provider, MD  ferrous sulfate 325 (65 FE) MG tablet Take 325 mg by mouth 2 (two) times daily with a meal.   Yes Historical Provider, MD  gabapentin (NEURONTIN) 300 MG capsule Take 300 mg by mouth 3 (three) times daily.   Yes Historical Provider, MD  ipratropium-albuterol (DUONEB) 0.5-2.5 (3) MG/3ML SOLN  Take 3 mLs by nebulization 3 (three) times daily. 04/22/13  Yes Samuella Cota, MD  lactulose (CEPHULAC) 10 g packet Take 10 g by mouth 3 (three) times daily as needed (constipation).   Yes Historical Provider, MD  lovastatin (MEVACOR) 20 MG tablet Take 20 mg by mouth at bedtime.   Yes Historical Provider, MD  magnesium oxide (MAG-OX) 400 MG tablet Take 400 mg by mouth 2 (two) times daily.   Yes Historical Provider, MD  metolazone (ZAROXOLYN) 2.5 MG tablet Take 2.5 mg by mouth as directed. Every 3 days   Yes Historical Provider, MD  Omega-3 350 MG CPDR Take 350 mg by mouth daily.   Yes Historical Provider, MD  repaglinide (PRANDIN) 2 MG tablet Take 2 mg by mouth 3 (three) times daily.    Yes Historical Provider, MD  rOPINIRole (REQUIP) 1 MG tablet Take 1 mg by mouth at bedtime.   Yes Historical Provider, MD  sitaGLIPtan-metformin (JANUMET) 50-1000 MG per tablet Take 1 tablet by mouth 2 (two) times daily with a meal.     Yes Historical Provider, MD  spironolactone (ALDACTONE) 25 MG tablet Take 25 mg by mouth daily.   Yes Historical Provider, MD  torsemide (DEMADEX) 20 MG tablet Take 60 mg by mouth 2 (two) times daily.   Yes Historical Provider, MD  albuterol (PROVENTIL) (2.5 MG/3ML) 0.083% nebulizer solution Take 2.5 mg by nebulization every 6 (six) hours as needed for wheezing or shortness of breath.    Historical Provider, MD  aspirin EC 81 MG EC tablet Take 1 tablet (81 mg total) by mouth daily. Patient not taking: Reported on 07/30/2015 04/22/13   Samuella Cota, MD  cefUROXime (CEFTIN) 500 MG tablet Take 1 tablet (500 mg total) by mouth 2 (two) times daily with a meal. Patient not taking: Reported on 07/30/2015 04/22/13   Samuella Cota, MD  furosemide (LASIX) 20 MG tablet Take 1 tablet (20 mg total) by mouth daily as needed for fluid. Weigh your self daily. Take one tablet daily as needed for increased swelling. Patient not taking: Reported on 07/30/2015 04/23/13   Samuella Cota, MD     Family History Family History  Problem Relation Age of Onset  . Hypertension Mother   . Emphysema Father   . Heart disease Sister     Enlarged heart    Social History Social History  Substance Use Topics  . Smoking status: Former Research scientist (life sciences)  . Smokeless tobacco: Not on file     Comment: Smoked 20 years on and off, quit >17 years ago  . Alcohol use No     Comment: Regular/ heavy use for 20 years, quit >17 years ago     Allergies   Review of patient's allergies indicates no known allergies.   Review of Systems Review of Systems  All other systems reviewed and are negative.    Physical Exam Updated Vital Signs BP 103/56   Pulse 92   Temp 98.1 F (  36.7 C) (Oral)   Resp 18   Ht 5\' 10"  (1.778 m)   Wt 200 lb (90.7 kg)   SpO2 98%   BMI 28.70 kg/m   Physical Exam  Constitutional: He appears well-developed.  Appears older than stated age.  HENT:  Head: Normocephalic and atraumatic.  Eyes: Conjunctivae are normal.  Neck: Neck supple.  Cardiovascular: Normal rate and regular rhythm.   No murmur heard. Pulmonary/Chest: Effort normal and breath sounds normal. No respiratory distress. He has no wheezes.  Abdominal: Soft. Bowel sounds are normal. He exhibits distension. There is no tenderness.  Musculoskeletal: He exhibits edema (1-2+ lower legs bilaterally). He exhibits no tenderness.  Neurological: He is alert. No cranial nerve deficit. Coordination normal.  Skin: Skin is warm and dry.  Psychiatric: He has a normal mood and affect. His behavior is normal.  Nursing note and vitals reviewed.    ED Treatments / Results  Labs (all labs ordered are listed, but only abnormal results are displayed) Labs Reviewed  CBG MONITORING, ED - Abnormal; Notable for the following:       Result Value   Glucose-Capillary 100 (*)    All other components within normal limits  PROTIME-INR  COMPREHENSIVE METABOLIC PANEL  TYPE AND SCREEN    EKG  EKG  Interpretation  Date/Time:  Thursday July 30 2015 17:42:20 EDT Ventricular Rate:  92 PR Interval:    QRS Duration: 139 QT Interval:  358 QTC Calculation: 443 R Axis:   -33 Text Interpretation:  Sinus rhythm Multiple premature complexes, vent & supraven Borderline prolonged PR interval Left bundle branch block since last tracing no significant change Confirmed by Eulis Foster  MD, Aloys Hupfer [id number]) on 07/30/2015 5:55:02 PM       Radiology No results found.  Procedures Procedures (including critical care time)  Medications Ordered in ED Medications - No data to display   Initial Impression / Assessment and Plan / ED Course  I have reviewed the triage vital signs and the nursing notes.  Pertinent labs & imaging results that were available during my care of the patient were reviewed by me and considered in my medical decision making (see chart for details).  Clinical Course    Medications  naproxen (NAPROSYN) tablet 500 mg (500 mg Oral Given 07/30/15 1935)  albumin human 25 % solution 50 g (50 g Intravenous Given 07/31/15 1042)  polyethylene glycol-electrolytes (NuLYTELY/GoLYTELY) solution 4,000 mL (4,000 mLs Oral Given 08/01/15 0004)  midazolam (VERSED) 5 MG/5ML injection (  Duplicate 123456 0000000)  meperidine (DEMEROL) 100 MG/ML injection (  Duplicate 123456 0000000)  lidocaine (XYLOCAINE) 2 % viscous mouth solution (  Duplicate 123456 0000000)  0.9 %  sodium chloride infusion ( Intravenous New Bag/Given 08/01/15 2030)    No data found.       Final Clinical Impressions(s) / ED Diagnoses   Final diagnoses:  Ascites  Anemia, unspecified anemia type  Rectal bleed  Abdominal discomfort    Ascites, recurrent with abdominal discomfort. Doubt spontaneous bacterial peritonitis. Hemoglobin low with evidence for gastrointestinal bleeding. He will require admission for further evaluation and treatment. No indication for paracentesis in the emergency department at this time.  Nursing Notes  Reviewed/ Care Coordinated, and agree without changes. Applicable Imaging Reviewed.  Interpretation of Laboratory Data incorporated into ED treatment   Plan: Admit  New Prescriptions New Prescriptions   No medications on file     Daleen Bo, MD 08/04/15 [phone]

## 2015-07-30 NOTE — H&P (Signed)
History and Physical    Jerry Williams [id number] DOB: 1950-04-28 DOA: 07/30/2015  PCP: [facility]   Patient coming from: Home, via PCP office   Chief Complaint: Worsening ascites, abdominal discomfort, low Hgb   HPI: Jerry Williams is a 65 y.o. male with medical history significant for remote alcohol abuse, cirrhosis with refractory ascites, CAD status post CABG, COPD, chronic combined CHF, type 2 diabetes mellitus, and hypertension who presents to the emergency department at the direction of his PCP for evaluation of progressive abdominal ascites with abdominal discomfort and worsening chronic anemia. Patient reports being chronically ill with heart and liver disease, worsening over the past couple years. He denies any melena or hematochezia, but notes that his hemoglobin was 7.4 with his PCP today, down from close to 10 early this year. He has never required a transfusion. He is followed by cardiology and gastroenterology in the outpatient setting. He reports most recent echo down to 25% EF. He last underwent a large-volume paracentesis on 06/03/2015. He denies any history of SBP. He is currently on a course of ciprofloxacin for UTI. He denies any dysuria, suprapubic tenderness, or flank pain at this time. He denies any recent fevers or chills. He denies lightheadedness, chest pain, palpitations, focal numbness or weakness, or dyspnea.  ED Course: Upon arrival to the ED, patient is found to be afebrile, saturating well on room air, and with vital signs stable. EKG demonstrates a sinus rhythm with PVCs and chronic left bundle branch block. CMP features a sodium of 123, chloride 89, BUN 61, creatinine 1.40, and albumin of 2.1. CBC is notable for a leukocytosis to 11,500, hemoglobin 7.4, and thrombocytosis with platelet count 425,000. FOBT is positive. Patient has remained hemodynamically stable in the ED and will be observed on the medical surgical unit for  ongoing evaluation and management of worsening chronic anemia and worsening refractory ascites.  Review of Systems:  All other systems reviewed and apart from HPI, are negative.  Past Medical History:  Diagnosis Date  . Chronic kidney disease   . Colon polyp   . COPD (chronic obstructive pulmonary disease) ([place])   . Coronary artery disease 02/2010   Followed by Dr. Elsie Amis [facility]: [place] [facility]  . Diabetes mellitus   . Hyperlipidemia   . Hypertension   . IBS (irritable bowel syndrome)   . Obese   . OSA (obstructive sleep apnea) 01/2013   Needs BiPAP not afforded  . PONV (postoperative nausea and vomiting)   . PVC's (premature ventricular contractions)     Past Surgical History:  Procedure Laterality Date  . CARDIAC CATHETERIZATION  09/2010   Dr. Elsie Amis [facility]  . CHOLECYSTECTOMY  02/14/2011   Procedure: LAPAROSCOPIC CHOLECYSTECTOMY;  Surgeon: Donato Heinz, MD;  Location: [facility] ORS;  Service: General;  Laterality: N/A;  . CORONARY ARTERY BYPASS GRAFT  09/2010   [facility]-Dr. Lunette Stands  . CYSTECTOMY     Scrotal abscess     reports that he has quit smoking. He does not have any smokeless tobacco history on file. He reports that he does not drink alcohol or use drugs.  No Known Allergies  Family History  Problem Relation Age of Onset  . Hypertension Mother   . Emphysema Father   . Heart disease Sister     Enlarged heart     Prior to Admission medications   Medication Sig Start Date End Date Taking? Authorizing Provider  aspirin 325 MG tablet Take 325 mg by mouth  daily.   Yes Historical Provider, MD  ciprofloxacin (CIPRO) 500 MG tablet Take 500 mg by mouth 2 (two) times daily.   Yes Historical Provider, MD  dicyclomine (BENTYL) 20 MG tablet Take 20 mg by mouth 3 (three) times daily as needed for spasms. For stomach   Yes Historical Provider, MD  digoxin (LANOXIN) 0.125 MG tablet Take 0.125 mg by mouth daily.   Yes Historical Provider, MD  ferrous sulfate 325 (65 FE) MG tablet Take 325 mg  by mouth 2 (two) times daily with a meal.   Yes Historical Provider, MD  gabapentin (NEURONTIN) 300 MG capsule Take 300 mg by mouth 3 (three) times daily.   Yes Historical Provider, MD  ipratropium-albuterol (DUONEB) 0.5-2.5 (3) MG/3ML SOLN Take 3 mLs by nebulization 3 (three) times daily. 04/22/13  Yes Samuella Cota, MD  lactulose (CEPHULAC) 10 g packet Take 10 g by mouth 3 (three) times daily as needed (constipation).   Yes Historical Provider, MD  lovastatin (MEVACOR) 20 MG tablet Take 20 mg by mouth at bedtime.   Yes Historical Provider, MD  magnesium oxide (MAG-OX) 400 MG tablet Take 400 mg by mouth 2 (two) times daily.   Yes Historical Provider, MD  metolazone (ZAROXOLYN) 2.5 MG tablet Take 2.5 mg by mouth as directed. Every 3 days   Yes Historical Provider, MD  Omega-3 350 MG CPDR Take 350 mg by mouth daily.   Yes Historical Provider, MD  repaglinide (PRANDIN) 2 MG tablet Take 2 mg by mouth 3 (three) times daily.    Yes Historical Provider, MD  rOPINIRole (REQUIP) 1 MG tablet Take 1 mg by mouth at bedtime.   Yes Historical Provider, MD  sitaGLIPtan-metformin (JANUMET) 50-1000 MG per tablet Take 1 tablet by mouth 2 (two) times daily with a meal.     Yes Historical Provider, MD  spironolactone (ALDACTONE) 25 MG tablet Take 25 mg by mouth daily.   Yes Historical Provider, MD  torsemide (DEMADEX) 20 MG tablet Take 60 mg by mouth 2 (two) times daily.   Yes Historical Provider, MD  albuterol (PROVENTIL) (2.5 MG/3ML) 0.083% nebulizer solution Take 2.5 mg by nebulization every 6 (six) hours as needed for wheezing or shortness of breath.    Historical Provider, MD  aspirin EC 81 MG EC tablet Take 1 tablet (81 mg total) by mouth daily. Patient not taking: Reported on 07/30/2015 04/22/13   Samuella Cota, MD  cefUROXime (CEFTIN) 500 MG tablet Take 1 tablet (500 mg total) by mouth 2 (two) times daily with a meal. Patient not taking: Reported on 07/30/2015 04/22/13   Samuella Cota, MD  furosemide  (LASIX) 20 MG tablet Take 1 tablet (20 mg total) by mouth daily as needed for fluid. Weigh your self daily. Take one tablet daily as needed for increased swelling. Patient not taking: Reported on 07/30/2015 04/23/13   Samuella Cota, MD    Physical Exam: Vitals:   07/30/15 1830 07/30/15 1900 07/30/15 1930 07/30/15 2030  BP: 95/63 103/56 104/60 97/57  Pulse: 90 92 92 93  Resp: 24 18 20 25   Temp:      TempSrc:      SpO2: 96% 98% 99% 99%  Weight:      Height:          Constitutional: NAD, calm, comfortable, appears chronically-ill and older than stated age Eyes: PERTLA, lids and conjunctivae normal ENMT: Mucous membranes are moist. Posterior pharynx clear of any exudate or lesions.   Neck: normal, supple, no  masses, no thyromegaly Respiratory: clear to auscultation bilaterally, no wheezing, no crackles. Normal respiratory effort.    Cardiovascular: S1 & S2 heard, regular rate and rhythm, grade 3 systolic murmur at lower sternal border. BLE edema into thighs. Abdomen: Distended with fluid-wave and shifting dullness; non-tender. Bowel sounds normal.  Musculoskeletal: no clubbing / cyanosis. No joint deformity upper and lower extremities. Normal muscle tone.  Skin: no significant rashes, lesions, ulcers. Warm, dry, well-perfused. Neurologic: CN 2-12 grossly intact. Sensation intact, DTR normal. Strength 5/5 in all 4 limbs.  Psychiatric: Normal judgment and insight. Alert and oriented x 3. Normal mood and affect.     Labs on Admission: I have personally reviewed following labs and imaging studies  CBC:  Recent Labs Lab 07/30/15 2011  WBC 11.5*  NEUTROABS 9.2*  HGB 7.4*  HCT 23.4*  MCV 82.1  PLT [id number]*   Basic Metabolic Panel:  Recent Labs Lab 07/30/15 1800  NA 123*  K 4.3  CL 89*  CO2 24  GLUCOSE 102*  BUN 61*  CREATININE 1.40*  CALCIUM 10.2   GFR: Estimated Creatinine Clearance: 61.2 mL/min (by C-G formula based on SCr of 1.4 mg/dL). Liver Function  Tests:  Recent Labs Lab 07/30/15 1800  AST 41  ALT 13*  ALKPHOS 281*  BILITOT 0.4  PROT 8.2*  ALBUMIN 2.1*   No results for input(s): LIPASE, AMYLASE in the last 168 hours. No results for input(s): AMMONIA in the last 168 hours. Coagulation Profile:  Recent Labs Lab 07/30/15 1800  INR 1.13   Cardiac Enzymes: No results for input(s): CKTOTAL, CKMB, CKMBINDEX, TROPONINI in the last 168 hours. BNP (last 3 results) No results for input(s): PROBNP in the last 8760 hours. HbA1C: No results for input(s): HGBA1C in the last 72 hours. CBG:  Recent Labs Lab 07/30/15 1832  GLUCAP 100*   Lipid Profile: No results for input(s): CHOL, HDL, LDLCALC, TRIG, CHOLHDL, LDLDIRECT in the last 72 hours. Thyroid Function Tests: No results for input(s): TSH, T4TOTAL, FREET4, T3FREE, THYROIDAB in the last 72 hours. Anemia Panel: No results for input(s): VITAMINB12, FOLATE, FERRITIN, TIBC, IRON, RETICCTPCT in the last 72 hours. Urine analysis:    Component Value Date/Time   COLORURINE YELLOW 04/16/2013 1808   APPEARANCEUR CLEAR 04/16/2013 1808   LABSPEC >1.030 (H) 04/16/2013 1808   PHURINE 5.0 04/16/2013 1808   GLUCOSEU NEGATIVE 04/16/2013 1808   HGBUR LARGE (A) 04/16/2013 1808   BILIRUBINUR SMALL (A) 04/16/2013 1808   KETONESUR NEGATIVE 04/16/2013 1808   PROTEINUR 100 (A) 04/16/2013 1808   UROBILINOGEN 1.0 04/16/2013 1808   NITRITE NEGATIVE 04/16/2013 1808   LEUKOCYTESUR NEGATIVE 04/16/2013 1808   Sepsis Labs: @LABRCNTIP (procalcitonin:4,lacticidven:4) )No results found for this or any previous visit (from the past 240 hour(s)).   Radiological Exams on Admission: No results found.  EKG: Independently reviewed. Sinus, PVC's, LBBB, no significant change from prior  Assessment/Plan  1. Cirrhosis with refractory ascites  - Followed by GI at [facility] with last large-vol para on 06/03/15  - IR consultation for therapeutic paracentesis requested; pt NPO after MN; INR wnl  - Albumin  ordered to be given immediately following paracentesis - Continue current treatment with torsemide, Aldactone, lactulose    2. Chronic combined systolic and diastolic CHF  - TTE ([id number]) with EF 40-45%, mild LVH, grade 2 diastolic dysfunction, mod LAE, mild TR, mod PA pp elevation - Pt reports more recent echo with EF ~25%  - Continue current management with torsemide, Aldactone, metolazone, digoxin  - SLIV, fluid-restrict diet,  follow daily wts and strict I/O's    3. Acute on chronic GI blood-loss anemia - Hgb 7.4 on admission with normal MCV and FOBT+ stool  - Has been gradually trending down, followed by PCP  - Pt denies melena or hematochezia, though FOBT is positive  - Pt reports yrs-long hx of fatigue and weakness he attributes to heart and liver dz, but denies significant recent worsening  - Type and screen performed; will hold off on transfusing for now as he is asymptomatic, there is no evidence for brisk losses or ACS - Repeat CBC in am - Continue iron supplementation  4. Hyponatremia  - Serum sodium 123 on admission  - Likely secondary to CHF and cirrhosis  - Repeat chem panel in am    5. UTI - Present at time of arrival, currently on Cipro  - Continue ciprofloxacin to complete the course  - Check UA and culture    6. CAD - s/p CABG  - No anginal complaints; no acute ischemic features to EKG  - Continue current management with statin; hold ASA given anemia with suspected GIB  7. Type II DM - A1c was 8.2% in 2015  - Managed at home with Janumet and Prandin; holding these while in hospital  - Check CBG with meals and qHS  - Start with low-intensity SSI correctional and adjust prn   8. COPD - Stable with no wheezes or cough  - Continue current management with DuoNeb    DVT prophylaxis: SCD's  Code Status: Full  Family Communication: Discussed with patient Disposition Plan: Observe on med-surg Consults called: None Admission status: Observation    Vianne Bulls, MD [facility] Pager [phone]  If 7PM-7AM, please contact night-coverage [email or web address] Password [facility]  07/30/2015, 10:22 PM

## 2015-07-31 ENCOUNTER — Observation Stay (HOSPITAL_COMMUNITY): Payer: BLUE CROSS/BLUE SHIELD

## 2015-07-31 ENCOUNTER — Encounter (HOSPITAL_COMMUNITY): Payer: Self-pay | Admitting: General Practice

## 2015-07-31 DIAGNOSIS — Z951 Presence of aortocoronary bypass graft: Secondary | ICD-10-CM | POA: Diagnosis not present

## 2015-07-31 DIAGNOSIS — K2970 Gastritis, unspecified, without bleeding: Secondary | ICD-10-CM | POA: Diagnosis not present

## 2015-07-31 DIAGNOSIS — Z86010 Personal history of colonic polyps: Secondary | ICD-10-CM | POA: Diagnosis not present

## 2015-07-31 DIAGNOSIS — I130 Hypertensive heart and chronic kidney disease with heart failure and stage 1 through stage 4 chronic kidney disease, or unspecified chronic kidney disease: Secondary | ICD-10-CM | POA: Diagnosis present

## 2015-07-31 DIAGNOSIS — J449 Chronic obstructive pulmonary disease, unspecified: Secondary | ICD-10-CM | POA: Diagnosis present

## 2015-07-31 DIAGNOSIS — K7460 Unspecified cirrhosis of liver: Principal | ICD-10-CM

## 2015-07-31 DIAGNOSIS — K648 Other hemorrhoids: Secondary | ICD-10-CM | POA: Diagnosis present

## 2015-07-31 DIAGNOSIS — D62 Acute posthemorrhagic anemia: Secondary | ICD-10-CM | POA: Diagnosis present

## 2015-07-31 DIAGNOSIS — D124 Benign neoplasm of descending colon: Secondary | ICD-10-CM | POA: Diagnosis not present

## 2015-07-31 DIAGNOSIS — R109 Unspecified abdominal pain: Secondary | ICD-10-CM | POA: Diagnosis present

## 2015-07-31 DIAGNOSIS — K449 Diaphragmatic hernia without obstruction or gangrene: Secondary | ICD-10-CM | POA: Diagnosis present

## 2015-07-31 DIAGNOSIS — K222 Esophageal obstruction: Secondary | ICD-10-CM | POA: Diagnosis not present

## 2015-07-31 DIAGNOSIS — I2510 Atherosclerotic heart disease of native coronary artery without angina pectoris: Secondary | ICD-10-CM | POA: Diagnosis present

## 2015-07-31 DIAGNOSIS — I5042 Chronic combined systolic (congestive) and diastolic (congestive) heart failure: Secondary | ICD-10-CM

## 2015-07-31 DIAGNOSIS — E785 Hyperlipidemia, unspecified: Secondary | ICD-10-CM | POA: Diagnosis present

## 2015-07-31 DIAGNOSIS — N183 Chronic kidney disease, stage 3 (moderate): Secondary | ICD-10-CM | POA: Diagnosis present

## 2015-07-31 DIAGNOSIS — R188 Other ascites: Secondary | ICD-10-CM | POA: Diagnosis present

## 2015-07-31 DIAGNOSIS — Z8249 Family history of ischemic heart disease and other diseases of the circulatory system: Secondary | ICD-10-CM | POA: Diagnosis not present

## 2015-07-31 DIAGNOSIS — G4733 Obstructive sleep apnea (adult) (pediatric): Secondary | ICD-10-CM | POA: Diagnosis present

## 2015-07-31 DIAGNOSIS — E1122 Type 2 diabetes mellitus with diabetic chronic kidney disease: Secondary | ICD-10-CM | POA: Diagnosis present

## 2015-07-31 DIAGNOSIS — E871 Hypo-osmolality and hyponatremia: Secondary | ICD-10-CM | POA: Diagnosis present

## 2015-07-31 DIAGNOSIS — D123 Benign neoplasm of transverse colon: Secondary | ICD-10-CM | POA: Diagnosis not present

## 2015-07-31 DIAGNOSIS — D649 Anemia, unspecified: Secondary | ICD-10-CM | POA: Diagnosis not present

## 2015-07-31 DIAGNOSIS — Q438 Other specified congenital malformations of intestine: Secondary | ICD-10-CM | POA: Diagnosis not present

## 2015-07-31 DIAGNOSIS — K922 Gastrointestinal hemorrhage, unspecified: Secondary | ICD-10-CM

## 2015-07-31 DIAGNOSIS — Z825 Family history of asthma and other chronic lower respiratory diseases: Secondary | ICD-10-CM | POA: Diagnosis not present

## 2015-07-31 DIAGNOSIS — Z87891 Personal history of nicotine dependence: Secondary | ICD-10-CM | POA: Diagnosis not present

## 2015-07-31 DIAGNOSIS — N390 Urinary tract infection, site not specified: Secondary | ICD-10-CM | POA: Diagnosis present

## 2015-07-31 DIAGNOSIS — K529 Noninfective gastroenteritis and colitis, unspecified: Secondary | ICD-10-CM | POA: Diagnosis present

## 2015-07-31 LAB — CBC
HEMATOCRIT: 22.3 % — AB (ref 39.0–52.0)
HEMOGLOBIN: 7.1 g/dL — AB (ref 13.0–17.0)
MCH: 26.4 pg (ref 26.0–34.0)
MCHC: 31.8 g/dL (ref 30.0–36.0)
MCV: 82.9 fL (ref 78.0–100.0)
Platelets: 379 10*3/uL (ref 150–400)
RBC: 2.69 MIL/uL — ABNORMAL LOW (ref 4.22–5.81)
RDW: 17.4 % — AB (ref 11.5–15.5)
WBC: 7.9 10*3/uL (ref 4.0–10.5)

## 2015-07-31 LAB — ABO/RH: ABO/RH(D): A POS

## 2015-07-31 LAB — URINALYSIS, ROUTINE W REFLEX MICROSCOPIC
Bilirubin Urine: NEGATIVE
Glucose, UA: NEGATIVE mg/dL
Hgb urine dipstick: NEGATIVE
Ketones, ur: NEGATIVE mg/dL
Nitrite: POSITIVE — AB
Protein, ur: NEGATIVE mg/dL
Specific Gravity, Urine: 1.01 (ref 1.005–1.030)
pH: 5.5 (ref 5.0–8.0)

## 2015-07-31 LAB — BASIC METABOLIC PANEL WITH GFR
Anion gap: 8 (ref 5–15)
BUN: 65 mg/dL — ABNORMAL HIGH (ref 6–20)
CO2: 28 mmol/L (ref 22–32)
Calcium: 10.1 mg/dL (ref 8.9–10.3)
Chloride: 93 mmol/L — ABNORMAL LOW (ref 101–111)
Creatinine, Ser: 1.61 mg/dL — ABNORMAL HIGH (ref 0.61–1.24)
GFR calc Af Amer: 51 mL/min — ABNORMAL LOW
GFR calc non Af Amer: 44 mL/min — ABNORMAL LOW
Glucose, Bld: 172 mg/dL — ABNORMAL HIGH (ref 65–99)
Potassium: 3.9 mmol/L (ref 3.5–5.1)
Sodium: 129 mmol/L — ABNORMAL LOW (ref 135–145)

## 2015-07-31 LAB — PREPARE RBC (CROSSMATCH)

## 2015-07-31 LAB — URINE MICROSCOPIC-ADD ON

## 2015-07-31 LAB — MRSA PCR SCREENING: MRSA BY PCR: NEGATIVE

## 2015-07-31 LAB — GLUCOSE, CAPILLARY
GLUCOSE-CAPILLARY: 187 mg/dL — AB (ref 65–99)
GLUCOSE-CAPILLARY: 181 mg/dL — AB (ref 65–99)
GLUCOSE-CAPILLARY: 256 mg/dL — AB (ref 65–99)
Glucose-Capillary: 208 mg/dL — ABNORMAL HIGH (ref 65–99)

## 2015-07-31 MED ORDER — BISACODYL 5 MG PO TBEC
10.0000 mg | DELAYED_RELEASE_TABLET | Freq: Two times a day (BID) | ORAL | Status: DC
  Administered 2015-07-31: 10 mg via ORAL
  Filled 2015-07-31 (×2): qty 2

## 2015-07-31 MED ORDER — SODIUM CHLORIDE 0.9 % IV SOLN: Freq: Once | INTRAVENOUS | Status: DC

## 2015-07-31 MED ORDER — IPRATROPIUM-ALBUTEROL 0.5-2.5 (3) MG/3ML IN SOLN
3.0000 mL | Freq: Three times a day (TID) | RESPIRATORY_TRACT | Status: DC
  Administered 2015-07-31 – 2015-08-02 (×7): 3 mL via RESPIRATORY_TRACT
  Filled 2015-07-31 (×7): qty 3

## 2015-07-31 MED ORDER — CIPROFLOXACIN HCL 250 MG PO TABS
500.0000 mg | ORAL_TABLET | Freq: Two times a day (BID) | ORAL | Status: DC
  Administered 2015-07-31: 500 mg via ORAL
  Filled 2015-07-31 (×2): qty 2

## 2015-07-31 MED ORDER — PEG 3350-KCL-NA BICARB-NACL 420 G PO SOLR
4000.0000 mL | Freq: Once | ORAL | Status: AC
  Administered 2015-08-01: 4000 mL via ORAL
  Filled 2015-07-31: qty 4000

## 2015-07-31 NOTE — Progress Notes (Signed)
Paracentesis complete no signs of distress. 5700 ml dark red colored ascites removed.

## 2015-07-31 NOTE — Progress Notes (Signed)
PROGRESS NOTE    Jerry Williams  [id number] DOB: 20-Jun-1950 DOA: 07/30/2015 PCP: [facility]:  [facility]   Brief Narrative:  65 year old male with a hx of remote alcohol abuse, cirrhosis with refractory ascites, presented to the ED at the direction of his PCP for evaluation of progressive abdominal ascites. While in the ED, patient was found to be saturating well, afebrile, and with stable vital signs. He was admitted for further management of worsening chronic anemia and worsening refractory ascites. Abdominal paracentesis was performed 7/28 with the removal of 5700 mL of bloody fluid.   Assessment & Plan:   Principal Problem:   Ascites Active Problems:   Non-insulin dependent type 2 diabetes mellitus (HCC)   Hypertension   CAD (coronary artery disease)   CKD (chronic kidney disease), stage III   Chronic GI bleeding   Normocytic anemia   Hyponatremia   Leukocytosis   Thrombocytosis (HCC)   Chronic combined systolic and diastolic CHF (congestive heart failure) (HCC)   Cirrhosis (HCC)   UTI (lower urinary tract infection)  1. Cirrhosis with refractory ascites. Followed by GI at [facility]. Continue treatment with torsemide, aldactone, and lactulose. 2. Chronic combined systolic and diastolic CHF. Past ECHO 4/14 with EF 40-45%, though patient reports more recent ECHO with EF 25%. Fluid restriction and monitor I+Os. Continue torsemide, aldactone, metolazone, and digoxin. Follow up repeat ECHO. 3. Acute on chronic GI blood loss anemia. Hgb 7.4 on admission. FOBT positive. Continue iron supplementation. Repeat CBC showed continued drop in Hgb, will transfuse 1 unit of pRBCs. Continue to monitor hgb, transfuse when he is symptomatic. Consult GI. Pt reports that he has had endoscopy and colonoscopy done by Dr. Gala Romney in the past. He does not take nay frequent NSAIDS. 4. Hyponatremia. Likely related to CHF/cirrhosis. Improving  with diuresis. 5. UTI. UA equivocal. Urine culture in process. Continue Cipro. 6. CAD s/p CABG. No complaints of CP. Continue statins, hold ASA given anemia with suspected GIB. 7. DM type 2. A1c from 2015 8.2%. Hold outpatient meds, continue SSI. 8. COPD. No evidence of wheeze. Continue management with Duonebs.   DVT prophylaxis: SCD's  Code Status: Full  Family Communication: Discussed with patient and family present at bedside Disposition Plan: Observe on med-surg  Consultants:   none  Procedures:   US guided paracentesis 7/28: 5700 mL of bloody fluid.  Antimicrobials:   Cipro 7/27 >>    Subjective: Feel improved after paracentesis. He has not noticed any evidence of bleeding or trouble breathing.   Objective: Vitals:   07/30/15 2200 07/30/15 2230 07/30/15 2343 07/31/15 0500  BP: 91/57 92/59 (!) 98/43 (!) 91/41  Pulse: 93 90 87 76  Resp: 20 17 20 20   Temp:   97.5 F (36.4 C) 97.5 F (36.4 C)  TempSrc:   Oral Axillary  SpO2: 98% 100% 100% 97%  Weight:      Height:       No intake or output data in the 24 hours ending 07/31/15 0558 Filed Weights   07/30/15 1747  Weight: 90.7 kg (200 lb)    Examination:  General exam: Appears calm and comfortable  Respiratory system: Clear to auscultation. Respiratory effort normal. Cardiovascular system: S1 & S2 heard, RRR. No JVD, murmurs, rubs, gallops or clicks. No pedal edema. Gastrointestinal system: Abdomen is distended, soft and nontender. No organomegaly or masses felt. Normal bowel sounds heard. Central nervous system: Alert and oriented. No focal neurological deficits. Extremities: Symmetric  5 x 5 power. Skin: No rashes, lesions or ulcers Psychiatry: Judgement and insight appear normal. Mood & affect appropriate.     Data Reviewed: I have personally reviewed following labs and imaging studies  CBC:  Recent Labs Lab 07/30/15 2011  WBC 11.5*  NEUTROABS 9.2*  HGB 7.4*  HCT 23.4*  MCV 82.1  PLT 425*    Basic Metabolic Panel:  Recent Labs Lab 07/30/15 1800  NA 123*  K 4.3  CL 89*  CO2 24  GLUCOSE 102*  BUN 61*  CREATININE 1.40*  CALCIUM 10.2   GFR: Estimated Creatinine Clearance: 61.2 mL/min (by C-G formula based on SCr of 1.4 mg/dL). Liver Function Tests:  Recent Labs Lab 07/30/15 1800  AST 41  ALT 13*  ALKPHOS 281*  BILITOT 0.4  PROT 8.2*  ALBUMIN 2.1*   Coagulation Profile:  Recent Labs Lab 07/30/15 1800  INR 1.13   CBG:  Recent Labs Lab 07/30/15 1832 07/30/15 2351  GLUCAP 100* 124*   Urine analysis:    Component Value Date/Time   COLORURINE YELLOW 07/31/2015 0125   APPEARANCEUR CLEAR 07/31/2015 0125   LABSPEC 1.010 07/31/2015 0125   PHURINE 5.5 07/31/2015 0125   GLUCOSEU NEGATIVE 07/31/2015 0125   HGBUR NEGATIVE 07/31/2015 0125   BILIRUBINUR NEGATIVE 07/31/2015 0125   KETONESUR NEGATIVE 07/31/2015 0125   PROTEINUR NEGATIVE 07/31/2015 0125   UROBILINOGEN 1.0 04/16/2013 1808   NITRITE POSITIVE (A) 07/31/2015 0125   LEUKOCYTESUR TRACE (A) 07/31/2015 0125   Sepsis Labs: @LABRCNTIP (procalcitonin:4,lacticidven:4)  ) Recent Results (from the past 240 hour(s))  MRSA PCR Screening     Status: None   Collection Time: 07/31/15 12:24 AM  Result Value Ref Range Status   MRSA by PCR NEGATIVE NEGATIVE Final    Comment:        The GeneXpert MRSA Assay (FDA approved for NASAL specimens only), is one component of a comprehensive MRSA colonization surveillance program. It is not intended to diagnose MRSA infection nor to guide or monitor treatment for MRSA infections.      Scheduled Meds: . sodium chloride   Intravenous Once  . albumin human  50 g Intravenous Once  . ciprofloxacin  500 mg Oral BID  . digoxin  0.125 mg Oral Daily  . ferrous sulfate  325 mg Oral BID WC  . gabapentin  300 mg Oral TID  . insulin aspart  0-15 Units Subcutaneous TID WC  . ipratropium-albuterol  3 mL Nebulization TID  . magnesium oxide  400 mg Oral BID  .  omega-3 acid ethyl esters  1,000 mg Oral Daily  . pravastatin  20 mg Oral q1800  . rOPINIRole  1 mg Oral QHS  . sodium chloride flush  3 mL Intravenous Q12H  . spironolactone  25 mg Oral Daily  . torsemide  60 mg Oral BID   Continuous Infusions:    LOS: 0 days    Time spent: 25 minutes  Kathie Dike, MD [facility] Pager [phone]  If 7PM-7AM, please contact night-coverage [email or web address] Password [email or web address] 07/31/2015, 5:58 AM   By signing my name below, I, Delene Ruffini, attest that this documentation has been prepared under the direction and in the presence of Kathie Dike, MD. Electronically Signed: Delene Ruffini 07/31/15 1:40pm  I, Dr. Kathie Dike, personally performed the services described in this documentaiton. All medical record entries made by the scribe were at my direction and in my presence. I have reviewed the chart and agree that the record reflects my personal performance and is  accurate and complete  Kathie Dike, MD, 07/31/2015 1:49 PM

## 2015-07-31 NOTE — Consult Note (Signed)
Referring Provider: [facility] Primary Care Physician:  [place] Primary Gastroenterologist:  [facility] GI & Dr. Gala Romney  Date of Admission: 07/30/15 Date of Consultation: 07/31/15  Reason for Consultation:  Ascites, heme+ stool, anemia  HPI:  Jerry Williams is a 65 y.o. male with a past medical history of chronic kidney disease, CAD, Heart failure, DM, hyperlipidemia, cirrhosis who is followed at [facility] presented on advice of PCP to the ED for worsening ascites and anemia. Noted remote history of ETOH 30 years ago. Last paracentesis 06/03/15 at [facility] which produced 5500 mL bloody serosanguineous ascites. 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 Ascites usually well managed with diuretics.  History of chronic anemia. Last labs at  [facility] 05/28/15 with H/H 7.8/24.5 with fluctuations between 7.3 - 9.6 over the course of 2017. CBC in the ED shows H/H 7.4/23.4; this morning 7.1/22.3. Scheduled CBC this afternoon. CMP shows elevated Alk Phos, mild elevation in AST/ALT within baseline. Hyponatremic, Cr 1.4 (within baseline), albumin 2.1 (within baseline). Bili normal 0.4.   Today he states he was having abdominal discomfort, which is improved after paracentesis. He confirms the above information "everyone has said the liver damage is from my heart." Last paracentesis 2 months ago. Denies obvious GI bleed. No other notable bleeding. Denies worsening abdominal pain, N/V, dyspnea, LE edema. Denies chest pain, fever, chills, yellowing of skin/eyes, darkened urine. No other upper or lower GI complaints   Past Medical History:  Diagnosis Date  . Chronic kidney disease   . Colon polyp   . COPD (chronic obstructive pulmonary disease) ([place])   . Coronary artery  disease 02/2010   Followed by Dr. Elsie Amis [facility]  . Diabetes mellitus   . Hyperlipidemia   . Hypertension   . IBS (irritable bowel syndrome)   . Obese   . OSA (obstructive sleep apnea) 01/2013   Needs BiPAP not afforded  . PONV (postoperative nausea and vomiting)   . PVC's (premature ventricular contractions)     Past Surgical History:  Procedure Laterality Date  . CARDIAC CATHETERIZATION  09/2010   Dr. Elsie Amis [facility]  . CHOLECYSTECTOMY  02/14/2011   Procedure: LAPAROSCOPIC CHOLECYSTECTOMY;  Surgeon: Donato Heinz, MD;  Location: [facility];  Service: General;  Laterality: N/A;  . CORONARY ARTERY BYPASS GRAFT  09/2010   [facility]-Dr. Lunette Stands  . CYSTECTOMY     Scrotal abscess    Prior to Admission medications   Medication Sig Start Date End Date Taking? Authorizing Provider  aspirin 325 MG tablet Take 325 mg by mouth daily.   Yes Historical Provider, MD  ciprofloxacin (CIPRO) 500 MG tablet Take 500 mg by mouth 2 (two) times daily.   Yes Historical Provider, MD  dicyclomine (BENTYL) 20 MG tablet Take 20 mg by mouth 3 (three) times daily as needed for spasms. For stomach   Yes Historical Provider, MD  digoxin (LANOXIN) 0.125 MG tablet Take 0.125 mg by mouth daily.   Yes Historical Provider, MD  ferrous sulfate 325 (65 FE) MG tablet Take 325 mg by mouth 2 (two) times daily with a meal.   Yes Historical Provider, MD  gabapentin (NEURONTIN) 300 MG capsule Take 300 mg by mouth 3 (three) times daily.   Yes Historical Provider, MD  ipratropium-albuterol (DUONEB) 0.5-2.5 (3) MG/3ML SOLN  Take 3 mLs by nebulization 3 (three) times daily. 04/22/13  Yes Samuella Cota, MD  lactulose (CEPHULAC) 10 g packet Take 10 g by mouth 3 (three) times daily as needed (constipation).   Yes Historical Provider, MD  lovastatin (MEVACOR) 20 MG tablet Take 20 mg by mouth at bedtime.   Yes Historical Provider, MD  magnesium oxide (MAG-OX) 400 MG tablet Take 400 mg by mouth 2 (two) times daily.   Yes Historical  Provider, MD  metolazone (ZAROXOLYN) 2.5 MG tablet Take 2.5 mg by mouth as directed. Every 3 days   Yes Historical Provider, MD  Omega-3 350 MG CPDR Take 350 mg by mouth daily.   Yes Historical Provider, MD  repaglinide (PRANDIN) 2 MG tablet Take 2 mg by mouth 3 (three) times daily.    Yes Historical Provider, MD  rOPINIRole (REQUIP) 1 MG tablet Take 1 mg by mouth at bedtime.   Yes Historical Provider, MD  sitaGLIPtan-metformin (JANUMET) 50-1000 MG per tablet Take 1 tablet by mouth 2 (two) times daily with a meal.     Yes Historical Provider, MD  spironolactone (ALDACTONE) 25 MG tablet Take 25 mg by mouth daily.   Yes Historical Provider, MD  torsemide (DEMADEX) 20 MG tablet Take 60 mg by mouth 2 (two) times daily.   Yes Historical Provider, MD  albuterol (PROVENTIL) (2.5 MG/3ML) 0.083% nebulizer solution Take 2.5 mg by nebulization every 6 (six) hours as needed for wheezing or shortness of breath.    Historical Provider, MD  aspirin EC 81 MG EC tablet Take 1 tablet (81 mg total) by mouth daily. Patient not taking: Reported on 07/30/2015 04/22/13   Samuella Cota, MD  cefUROXime (CEFTIN) 500 MG tablet Take 1 tablet (500 mg total) by mouth 2 (two) times daily with a meal. Patient not taking: Reported on 07/30/2015 04/22/13   Samuella Cota, MD  furosemide (LASIX) 20 MG tablet Take 1 tablet (20 mg total) by mouth daily as needed for fluid. Weigh your self daily. Take one tablet daily as needed for increased swelling. Patient not taking: Reported on 07/30/2015 04/23/13   Samuella Cota, MD    Current Facility-Administered Medications  Medication Dose Route Frequency Provider Last Rate Last Dose  . 0.9 %  sodium chloride infusion   Intravenous Once Timothy S Opyd, MD      . 0.9 %  sodium chloride infusion  250 mL Intravenous PRN Ilene Qua Opyd, MD      . 0.9 %  sodium chloride infusion   Intravenous Once Kathie Dike, MD      . acetaminophen (TYLENOL) tablet 650 mg  650 mg Oral Q6H PRN Vianne Bulls, MD       Or  . acetaminophen (TYLENOL) suppository 650 mg  650 mg Rectal Q6H PRN Ilene Qua Opyd, MD      . albuterol (PROVENTIL) (2.5 MG/3ML) 0.083% nebulizer solution 2.5 mg  2.5 mg Nebulization Q6H PRN Ilene Qua Opyd, MD      . bisacodyl (DULCOLAX) EC tablet 5 mg  5 mg Oral Daily PRN Vianne Bulls, MD      . ciprofloxacin (CIPRO) tablet 500 mg  500 mg Oral BID Vianne Bulls, MD   500 mg at 07/31/15 1100  . dicyclomine (BENTYL) capsule 20 mg  20 mg Oral TID PRN Vianne Bulls, MD      . digoxin (LANOXIN) tablet 0.125 mg  0.125 mg Oral Daily Vianne Bulls, MD   0.125 mg  at 07/31/15 1100  . ferrous sulfate tablet 325 mg  325 mg Oral BID WC Briscoe Deutscher, MD   325 mg at 07/31/15 1100  . gabapentin (NEURONTIN) capsule 300 mg  300 mg Oral TID Briscoe Deutscher, MD   300 mg at 07/31/15 1100  . HYDROcodone-acetaminophen (NORCO/VICODIN) 5-325 MG per tablet 1-2 tablet  1-2 tablet Oral Q4H PRN Briscoe Deutscher, MD   2 tablet at 07/31/15 1222  . insulin aspart (novoLOG) injection 0-15 Units  0-15 Units Subcutaneous TID WC Briscoe Deutscher, MD   3 Units at 07/31/15 1216  . ipratropium-albuterol (DUONEB) 0.5-2.5 (3) MG/3ML nebulizer solution 3 mL  3 mL Nebulization TID Briscoe Deutscher, MD   3 mL at 07/31/15 1405  . lactulose (CHRONULAC) 10 GM/15ML solution 10 g  10 g Oral TID PRN Briscoe Deutscher, MD      . magnesium oxide (MAG-OX) tablet 400 mg  400 mg Oral BID Briscoe Deutscher, MD   400 mg at 07/31/15 0018  . omega-3 acid ethyl esters (LOVAZA) capsule 1,000 mg  1,000 mg Oral Daily Briscoe Deutscher, MD   1,000 mg at 07/31/15 1100  . ondansetron (ZOFRAN) tablet 4 mg  4 mg Oral Q6H PRN Briscoe Deutscher, MD       Or  . ondansetron (ZOFRAN) injection 4 mg  4 mg Intravenous Q6H PRN Lavone Neri Opyd, MD      . polyethylene glycol (MIRALAX / GLYCOLAX) packet 17 g  17 g Oral Daily PRN Briscoe Deutscher, MD      . pravastatin (PRAVACHOL) tablet 20 mg  20 mg Oral q1800 Lavone Neri Opyd, MD      . rOPINIRole (REQUIP) tablet 1  mg  1 mg Oral QHS Briscoe Deutscher, MD   1 mg at 07/31/15 0018  . sodium chloride flush (NS) 0.9 % injection 3 mL  3 mL Intravenous Q12H Lavone Neri Opyd, MD   3 mL at 07/31/15 1000  . sodium chloride flush (NS) 0.9 % injection 3 mL  3 mL Intravenous PRN Briscoe Deutscher, MD   3 mL at 07/31/15 1051  . spironolactone (ALDACTONE) tablet 25 mg  25 mg Oral Daily Briscoe Deutscher, MD   25 mg at 07/31/15 1100  . torsemide (DEMADEX) tablet 60 mg  60 mg Oral BID Briscoe Deutscher, MD   60 mg at 07/31/15 1100    Allergies as of 07/30/2015  . (No Known Allergies)    Family History  Problem Relation Age of Onset  . Hypertension Mother   . Emphysema Father   . Heart disease Sister     Enlarged heart    Social History   Social History  . Marital status: Married    Spouse name: N/A  . Number of children: N/A  . Years of education: N/A   Occupational History  . Unemployed, laid off >10 years ago Unemployed   Social History Main Topics  . Smoking status: Former Games developer  . Smokeless tobacco: Not on file     Comment: Smoked 20 years on and off, quit >17 years ago  . Alcohol use No     Comment: Regular/ heavy use for 20 years, quit >17 years ago  . Drug use: No  . Sexual activity: Not Currently   Other Topics Concern  . Not on file   Social History Narrative   Walks and uses exercise bike    Review of Systems: 10-point ROS negative  except as per HPI.  Physical Exam: Vital signs in last 24 hours: Temp:  [97.5 F (36.4 C)-98.1 F (36.7 C)] 97.5 F (36.4 C) (07/28 1014) Pulse Rate:  [76-97] 82 (07/28 1014) Resp:  [17-32] 20 (07/28 1014) BP: (91-113)/(41-68) 101/57 (07/28 1014) SpO2:  [96 %-100 %] 98 % (07/28 1405) Weight:  [200 lb (90.7 kg)] 200 lb (90.7 kg) (07/27 1747) Last BM Date: 07/29/15 General:   Alert,  Well-developed, well-nourished, pleasant and cooperative in NAD. Appears to have muscle-wasting Head:  Normocephalic and atraumatic. Eyes:  Sclera clear, no icterus. Conjunctiva  pink. Ears:  Normal auditory acuity. Neck:  Supple; no masses or thyromegaly. Lungs:  Clear throughout to auscultation.   No wheezes, crackles, or rhonchi. No acute distress. Heart:  Regular rate and rhythm; no murmurs, clicks, rubs,  or gallops. Abdomen:  Mildly tense with minor fluid wave noted, distended, no TTP. No masses, hepatosplenomegaly or hernias noted although limited by abdominal girth. Normal bowel sounds, without guarding, and without rebound.   Rectal:  Deferred.   Pulses:  Normal bilateral DP pulses noted. Neurologic:  Alert and  oriented x4;  grossly normal neurologically. Skin:  Intact without significant lesions or rashes. Psych:  Alert and cooperative. Normal mood and affect.  Intake/Output from previous day: No intake/output data recorded. Intake/Output this shift: Total I/O In: -  Out: 250 [Urine:250]  Lab Results:  Recent Labs  07/30/15 2011 07/31/15 0613  WBC 11.5* 7.9  HGB 7.4* 7.1*  HCT 23.4* 22.3*  PLT 425* 379   BMET  Recent Labs  07/30/15 1800 07/31/15 0616  NA 123* 129*  K 4.3 3.9  CL 89* 93*  CO2 24 28  GLUCOSE 102* 172*  BUN 61* 65*  CREATININE 1.40* 1.61*  CALCIUM 10.2 10.1   LFT  Recent Labs  07/30/15 1800  PROT 8.2*  ALBUMIN 2.1*  AST 41  ALT 13*  ALKPHOS 281*  BILITOT 0.4   PT/INR  Recent Labs  07/30/15 1800  LABPROT 14.6  INR 1.13   Hepatitis Panel No results for input(s): HEPBSAG, HCVAB, HEPAIGM, HEPBIGM in the last 72 hours. C-Diff No results for input(s): CDIFFTOX in the last 72 hours.  Studies/Results: US Paracentesis  Result Date: 07/31/2015 INDICATION: Distended abdomen.  Ascites.  Cirrhosis EXAM: ULTRASOUND GUIDED  PARACENTESIS MEDICATIONS: None. COMPLICATIONS: None immediate. PROCEDURE: Informed written consent was obtained from the patient after a discussion of the risks, benefits and alternatives to treatment. A timeout was performed prior to the initiation of the procedure. Initial ultrasound  scanning demonstrates a large amount of ascites within the right lower abdominal quadrant. The right lower abdomen was prepped and draped in the usual sterile fashion. 1% lidocaine with epinephrine was used for local anesthesia. Following this, a Yueh catheter was introduced. An ultrasound image was saved for documentation purposes. The paracentesis was performed. The catheter was removed and a dressing was applied. The patient tolerated the procedure well without immediate post procedural complication. FINDINGS: A total of approximately 5700 mL of bloody fluid was removed. IMPRESSION: Successful ultrasound-guided paracentesis yielding 5,700 mL of bloody peritoneal fluid. Electronically Signed   By: Franchot Gallo M.D.   On: 07/31/2015 09:55   Impression: 65 year old male with multiple medical issues presented with worsening anemia and ascites. Sees cardiology and GI at [facility]. GI feels cirrhosis likely cardiogenic although A1AT is possible with strong family history of COPD. 5500 mL tap at [facility] 05/2015; paracentesis done today with 5700 mL serosanguineous fluid removed.  Cirrhosis: CMP within baseline, INR normal. Cirrhosis likely cardiogenic. Para this morning. On multiple diuretics as inpatient. Feels better after paracentesis.  Anemia: Hgb not far off of baseline for chronic anemia (see trends in HPI), plan for 1 u PRBC. Asymptomatic with his current anemia. Heme+ stool, no obvious GIB per patient. No other bleeding noted. Ascites serosanguineous.  Plan: 1. Transfuse as necessary 2. Continue diuresis 3. Monitor liver labs and CBC 4. Monitor for obvious GI bleed 5. Therapeutic paracentesis as needed 6. Supportive measures 7. No need for urgent endoscopic evaluation at this time; subject to change based on clinical progression   Walden Field, AGNP-C Adult & Gerontological Nurse Practitioner [facility]    LOS: 0 days     07/31/2015, 2:19 PM

## 2015-07-31 NOTE — Care Management Note (Signed)
Case Management Note  Patient Details  Name: Jerry Williams MRN: [id number] Date of Birth: 1950/08/31  Subjective/Objective:   Patient is from home with wife. Has a walker. Has a PCP and insurance, reports no issues.               Action/Plan: Anticipate DC home with self care. No CM needs known.   Expected Discharge Date:                  Expected Discharge Plan:  Home/Self Care  In-House Referral:  NA  Discharge planning Services  CM Consult  Post Acute Care Choice:  NA Choice offered to:  NA  DME Arranged:    DME Agency:     HH Arranged:    HH Agency:     Status of Service:  Completed, signed off  If discussed at [employer] of Stay Meetings, dates discussed:    Additional Comments:  Adan Beal, Chauncey Reading, RN 07/31/2015, 2:37 PM

## 2015-07-31 NOTE — Progress Notes (Signed)
Patient requesting that diet be advanced.  He had his paracentesis this morning.  Dr. Roderic Palau notified via text page.

## 2015-08-01 ENCOUNTER — Encounter (HOSPITAL_COMMUNITY)
Admission: EM | Disposition: A | Discharge status: Home / Self Care | Payer: Self-pay | Source: Home / Self Care | Attending: Internal Medicine

## 2015-08-01 ENCOUNTER — Encounter (HOSPITAL_COMMUNITY): Payer: Self-pay | Admitting: *Deleted

## 2015-08-01 DIAGNOSIS — K222 Esophageal obstruction: Secondary | ICD-10-CM

## 2015-08-01 DIAGNOSIS — K649 Unspecified hemorrhoids: Secondary | ICD-10-CM

## 2015-08-01 DIAGNOSIS — D369 Benign neoplasm, unspecified site: Secondary | ICD-10-CM

## 2015-08-01 DIAGNOSIS — K2970 Gastritis, unspecified, without bleeding: Secondary | ICD-10-CM

## 2015-08-01 DIAGNOSIS — D123 Benign neoplasm of transverse colon: Secondary | ICD-10-CM

## 2015-08-01 DIAGNOSIS — Q438 Other specified congenital malformations of intestine: Secondary | ICD-10-CM

## 2015-08-01 DIAGNOSIS — D124 Benign neoplasm of descending colon: Secondary | ICD-10-CM

## 2015-08-01 HISTORY — PX: COLONOSCOPY: SHX5424

## 2015-08-01 HISTORY — DX: Benign neoplasm, unspecified site: D36.9

## 2015-08-01 HISTORY — DX: Unspecified hemorrhoids: K64.9

## 2015-08-01 HISTORY — PX: ESOPHAGOGASTRODUODENOSCOPY: SHX5428

## 2015-08-01 LAB — CBC
HEMATOCRIT: 23.3 % — AB (ref 39.0–52.0)
HEMOGLOBIN: 7.5 g/dL — AB (ref 13.0–17.0)
MCH: 26.5 pg (ref 26.0–34.0)
MCHC: 32.2 g/dL (ref 30.0–36.0)
MCV: 82.3 fL (ref 78.0–100.0)
Platelets: 311 10*3/uL (ref 150–400)
RBC: 2.83 MIL/uL — AB (ref 4.22–5.81)
RDW: 16.7 % — ABNORMAL HIGH (ref 11.5–15.5)
WBC: 9.5 10*3/uL (ref 4.0–10.5)

## 2015-08-01 LAB — GLUCOSE, CAPILLARY
GLUCOSE-CAPILLARY: 238 mg/dL — AB (ref 65–99)
GLUCOSE-CAPILLARY: 201 mg/dL — AB (ref 65–99)
GLUCOSE-CAPILLARY: 364 mg/dL — AB (ref 65–99)
Glucose-Capillary: 229 mg/dL — ABNORMAL HIGH (ref 65–99)
Glucose-Capillary: 176 mg/dL — ABNORMAL HIGH (ref 65–99)

## 2015-08-01 LAB — BASIC METABOLIC PANEL
Anion gap: 10 (ref 5–15)
BUN: 55 mg/dL — AB (ref 6–20)
CHLORIDE: 92 mmol/L — AB (ref 101–111)
CO2: 26 mmol/L (ref 22–32)
Calcium: 9.4 mg/dL (ref 8.9–10.3)
Creatinine, Ser: 1.35 mg/dL — ABNORMAL HIGH (ref 0.61–1.24)
GFR calc non Af Amer: 54 mL/min — ABNORMAL LOW (ref >60)
Glucose, Bld: 218 mg/dL — ABNORMAL HIGH (ref 65–99)
POTASSIUM: 3.6 mmol/L (ref 3.5–5.1)
SODIUM: 128 mmol/L — AB (ref 135–145)

## 2015-08-01 SURGERY — COLONOSCOPY
Anesthesia: Moderate Sedation

## 2015-08-01 MED ORDER — SODIUM CHLORIDE 0.9 % IV SOLN: INTRAVENOUS | Status: DC

## 2015-08-01 MED ORDER — LIDOCAINE VISCOUS 2 % MT SOLN
OROMUCOSAL | Status: DC | PRN
  Administered 2015-08-01: 1 via OROMUCOSAL

## 2015-08-01 MED ORDER — MEPERIDINE HCL 25 MG/ML IJ SOLN
INTRAMUSCULAR | Status: DC | PRN
  Administered 2015-08-01: 25 mg via INTRAVENOUS

## 2015-08-01 MED ORDER — LIDOCAINE VISCOUS 2 % MT SOLN
OROMUCOSAL | Status: AC
  Filled 2015-08-01: qty 15

## 2015-08-01 MED ORDER — MEPERIDINE HCL 100 MG/ML IJ SOLN
INTRAMUSCULAR | Status: DC | PRN
  Administered 2015-08-01 (×3): 25 mg via INTRAVENOUS

## 2015-08-01 MED ORDER — MEPERIDINE HCL 100 MG/ML IJ SOLN
INTRAMUSCULAR | Status: AC
  Filled 2015-08-01: qty 2

## 2015-08-01 MED ORDER — STERILE WATER FOR IRRIGATION IR SOLN
Status: DC | PRN
  Administered 2015-08-01: 2.5 mL

## 2015-08-01 MED ORDER — MIDAZOLAM HCL 5 MG/5ML IJ SOLN
INTRAMUSCULAR | Status: AC
  Filled 2015-08-01: qty 10

## 2015-08-01 MED ORDER — MIDAZOLAM HCL 10 MG/2ML IJ SOLN
INTRAMUSCULAR | Status: DC | PRN
  Administered 2015-08-01: 1 mg via INTRAVENOUS

## 2015-08-01 MED ORDER — MIDAZOLAM HCL 5 MG/5ML IJ SOLN
INTRAMUSCULAR | Status: DC | PRN
  Administered 2015-08-01 (×3): 2 mg via INTRAVENOUS

## 2015-08-01 MED ORDER — PANTOPRAZOLE SODIUM 40 MG PO TBEC
40.0000 mg | DELAYED_RELEASE_TABLET | Freq: Every day | ORAL | Status: DC
Start: 2015-08-01 — End: 2015-08-02
  Administered 2015-08-01 – 2015-08-02 (×2): 40 mg via ORAL
  Filled 2015-08-01 (×2): qty 1

## 2015-08-01 MED ORDER — SODIUM CHLORIDE 0.9 % IV SOLN
Freq: Once | INTRAVENOUS | Status: AC
  Administered 2015-08-01: 21:00:00 via INTRAVENOUS

## 2015-08-01 NOTE — Op Note (Signed)
[facility] Patient Name: Jerry Williams Procedure Date: 08/01/2015 1:00 PM MRN: [id number] Date of Birth: [birth year] Attending MD: Barney Drain , MD CSN: [id number] Age: 65 Admit Type: Inpatient Procedure:                Colonoscopy with CODL FORCEPS BIOPSY/POLYPECTOMY Indications:              Anemia Providers:                Barney Drain, MD, Janeece Riggers, RN, Lurline Del, RN Referring MD:             Abran Richard, MD, Norvel Richards, MD, Mosie Epstein, MD Medicines:                Fentanyl 75 micrograms IV, Midazolam 6 mg IV Complications:            No immediate complications. Estimated Blood Loss:     Estimated blood loss was minimal. Procedure:                Pre-Anesthesia Assessment:                           - Prior to the procedure, a History and Physical                            was performed, and patient medications and                            allergies were reviewed. The patient's tolerance of                            previous anesthesia was also reviewed. The risks                            and benefits of the procedure and the sedation                            options and risks were discussed with the patient.                            All questions were answered, and informed consent                            was obtained. Prior Anticoagulants: The patient has                            taken aspirin, last dose was day of procedure. ASA                            Grade Assessment: III - A patient with severe                            systemic disease. After reviewing the risks and  benefits, the patient was deemed in satisfactory                            condition to undergo the procedure.After obtaining                            informed consent, the colonoscope was passed under                            direct vision. Throughout the procedure, the                            patient's  blood pressure, pulse, and oxygen                            saturations were monitored continuously. The                            [id number] scope was introduced through                            the anus and advanced to the the cecum, identified                            by appendiceal orifice and ileocecal valve. The                            ileocecal valve, appendiceal orifice, and rectum                            were photographed. The colonoscopy was somewhat                            difficult due to a tortuous colon. Successful                            completion of the procedure was aided by COLOWRAP.                            The quality of the bowel preparation was good. Scope In: 1:38:42 PM Scope Out: 2:15:18 PM Total Procedure Duration: 0 hours 36 minutes 35 seconds  Findings:      Two sessile polyps were found in the descending colon and hepatic       flexure. The polyps were 2 to 4 mm in size. These polyps were removed       with a cold biopsy forceps. Resection and retrieval were complete.      A segmental area of mildly erythematous mucosa was found in the distal       transverse colon. Biopsies were taken with a cold forceps for histology.      The recto-sigmoid colon and transverse colon were moderately tortuous.       AND COLOWRAP      Non-bleeding internal hemorrhoids were found. The hemorrhoids were small. Impression:               -  Two 2 to 4 mm polyps in the descending colon AND                            HEPATIC FLEXURE removed                           - MILD COLITIS in the distal transverse colon.                           - Tortuous colon.                           - Non-bleeding internal hemorrhoids. Moderate Sedation:      Moderate (conscious) sedation was administered by the endoscopy nurse       and supervised by the endoscopist. The following parameters were       monitored: oxygen saturation, heart rate, blood pressure, and response        to care. Total physician intraservice time was 69 minutes. Recommendation:           - Augmented water consumption diet, cardiac diet                            and low sodium diet.                           - Continue present medications.                           - Await pathology results.                           - Return patient to hospital ward.                           - Return to GI office in 6 weeks.                           - Repeat colonoscopy in 5-10 years for surveillance. Procedure Code(s):        --- Professional ---                           [phone], Colonoscopy, flexible; with biopsy, single                            or multiple                           99152, Moderate sedation services provided by the                            same physician or other qualified health care                            professional performing the diagnostic or  therapeutic service that the sedation supports,                            requiring the presence of an independent trained                            observer to assist in the monitoring of the                            patient's level of consciousness and physiological                            status; initial 15 minutes of intraservice time,                            patient age 29 years or older                           [id number], Moderate sedation services; each additional                            15 minutes intraservice time                           99153, Moderate sedation services; each additional                            15 minutes intraservice time                           99153, Moderate sedation services; each additional                            15 minutes intraservice time                           99153, Moderate sedation services; each additional                            15 minutes intraservice time Diagnosis Code(s):        --- Professional ---                            D12.4, Benign neoplasm of descending colon                           K63.89, Other specified diseases of intestine                           K64.8, Other hemorrhoids                           D64.9, Anemia, unspecified                           Q43.8, Other specified congenital malformations of  intestine CPT copyright 2016 [employer]. All rights reserved. The codes documented in this report are preliminary and upon coder review may  be revised to meet current compliance requirements. Barney Drain, MD Barney Drain, MD 08/01/2015 6:27:27 PM This report has been signed electronically. Number of Addenda: 0

## 2015-08-01 NOTE — Plan of Care (Addendum)
Pt seen. DISCUSSED WITH NURSING. PT STILL HAVING BROWN STOOL. SPENT LAST 45 MINS ON BEDSIDE COMMODE. CANCEL 1000 ENEMA. GIVE TAP WATER ENEMA x1 @1130 . DISCUSSED PLAN WITH PT AND WIFE.

## 2015-08-01 NOTE — Progress Notes (Signed)
PROGRESS NOTE    Jerry Williams  [id number] DOB: [birth year] DOA: 07/30/2015 PCP: [facility]:  Gertie Fey    Brief Narrative:  65 year old male with a hx of remote alcohol abuse, cirrhosis with refractory ascites, presented to the ED at the direction of his PCP for evaluation of progressive abdominal ascites. While in the ED, patient was found to be saturating well, afebrile, and with stable vital signs. He was admitted for further management of worsening chronic anemia and worsening refractory ascites. Abdominal paracentesis was performed 7/28 with the removal of 5700 mL of bloody fluid. GI has evaluated the patient, appreciate input.   Assessment & Plan:   Principal Problem:   Ascites Active Problems:   Non-insulin dependent type 2 diabetes mellitus (HCC)   Hypertension   CAD (coronary artery disease)   CKD (chronic kidney disease), stage III   Chronic GI bleeding   Normocytic anemia   Hyponatremia   Leukocytosis   Thrombocytosis (HCC)   Chronic combined systolic and diastolic CHF (congestive heart failure) (HCC)   Cirrhosis (HCC)   UTI (lower urinary tract infection)  1. Cirrhosis with ascites. Likely cardiogenic. Followed by GI at [facility]. Improved s/p abdominal paracentesis was performed 7/28 with the removal of 5700 mL of bloody fluid. GI has evaluated the patient, appreciate input. Continue treatment with torsemide, aldactone, and lactulose. 2. Chronic combined systolic and diastolic CHF. Past ECHO 4/14 with EF 40-45%, though patient reports more recent ECHO with EF 25%. Fluid restriction and monitor I+Os. Continue torsemide, aldactone, metolazone, and digoxin. Follow up repeat ECHO. 3. Acute on chronic GI blood loss anemia. Hgb 7.4 on admission. Repeat CBC showed continued drop in Hgb, was transfused 1 unit of pRBCs 7/28 without adequate response. He will be transfused another unit of PRBC today. FOBT positive. Continue to  monitor hgb, transfuse when he is symptomatic. GI has been consulted, input appreciated. Plan is to undergo colonoscopy and endoscopy.  He does not take any frequent NSAIDS. Continue iron supplementation. Continue PPI 4. Hyponatremia. Likely related to CHF/cirrhosis. Improving with diuresis. 5. UTI. UA equivocal. Urine culture in process. Continue Cipro. 6. CAD s/p CABG. No complaints of CP. Continue statins, hold ASA given anemia with suspected GIB. 7. DM type 2. A1c from 2015 8.2%. Hold outpatient meds, continue SSI. 8. COPD. No evidence of wheeze. Continue management with Duonebs.   DVT prophylaxis: SCD's  Code Status: Full  Family Communication: Discussed with patient and family present at bedside  Disposition Plan: likely discharge home in AM if continued improvement  Consultants:   GI  Procedures:   US guided paracentesis 7/28: 5700 mL of bloody fluid.  1 unit pRBC transfusion 7/28  1 unit prbc 7/29  Colonoscopy: diverticulosis; hepatic flexure polyp;splenic flexure polyp;distal transverse colon colitis;small, hemorrhoids  Endoscopy: esophageal stricture; hiatal hernia  Antimicrobials:   Cipro 7/27 >>    Subjective: Patient does not have any new complaints today. No shortness of breath  Objective: Vitals:   07/31/15 2030 07/31/15 2045 07/31/15 2100 07/31/15 2335  BP: (!) 94/56 (!) 89/51 125/85 (!) 101/56  Pulse: 91 95  99  Resp: 18 18  18   Temp: 97.5 F (36.4 C) 97.8 F (36.6 C)  98.8 F (37.1 C)  TempSrc: Oral Oral  Oral  SpO2: 97% 97%  96%  Weight:      Height:        Intake/Output Summary (Last 24 hours) at 08/01/15 0616 Last data filed at 07/31/15  2320  Gross per 24 hour  Intake              910 ml  Output             1550 ml  Net             -640 ml   Filed Weights   07/30/15 1747  Weight: 90.7 kg (200 lb)    Examination:  General exam: Appears calm and comfortable  Respiratory system: Clear to auscultation. Respiratory effort  normal. Cardiovascular system: S1 & S2 heard, RRR. No JVD, murmurs, rubs, gallops or clicks. No pedal edema. Gastrointestinal system: Abdomen is distended, soft and nontender. No organomegaly or masses felt. Normal bowel sounds heard. Central nervous system: Alert and oriented. No focal neurological deficits. Extremities: Symmetric 5 x 5 power. Skin: No rashes, lesions or ulcers Psychiatry: Judgement and insight appear normal. Mood & affect appropriate.   Data Reviewed: I have personally reviewed following labs and imaging studies  CBC:  Recent Labs Lab 07/30/15 2011 07/31/15 0613  WBC 11.5* 7.9  NEUTROABS 9.2*  --   HGB 7.4* 7.1*  HCT 23.4* 22.3*  MCV 82.1 82.9  PLT 425* [id number]   Basic Metabolic Panel:  Recent Labs Lab 07/30/15 1800 07/31/15 0616  NA 123* 129*  K 4.3 3.9  CL 89* 93*  CO2 24 28  GLUCOSE 102* 172*  BUN 61* 65*  CREATININE 1.40* 1.61*  CALCIUM 10.2 10.1   GFR: Estimated Creatinine Clearance: 53.2 mL/min (by C-G formula based on SCr of 1.61 mg/dL). Liver Function Tests:  Recent Labs Lab 07/30/15 1800  AST 41  ALT 13*  ALKPHOS 281*  BILITOT 0.4  PROT 8.2*  ALBUMIN 2.1*   Coagulation Profile:  Recent Labs Lab 07/30/15 1800  INR 1.13   CBG:  Recent Labs Lab 07/30/15 2351 07/31/15 0741 07/31/15 1151 07/31/15 1615 07/31/15 2058  GLUCAP 124* 187* 181* 208* 256*   Urine analysis:    Component Value Date/Time   COLORURINE YELLOW 07/31/2015 0125   APPEARANCEUR CLEAR 07/31/2015 0125   LABSPEC 1.010 07/31/2015 0125   PHURINE 5.5 07/31/2015 0125   GLUCOSEU NEGATIVE 07/31/2015 0125   HGBUR NEGATIVE 07/31/2015 0125   BILIRUBINUR NEGATIVE 07/31/2015 0125   KETONESUR NEGATIVE 07/31/2015 0125   PROTEINUR NEGATIVE 07/31/2015 0125   UROBILINOGEN 1.0 04/16/2013 1808   NITRITE POSITIVE (A) 07/31/2015 0125   LEUKOCYTESUR TRACE (A) 07/31/2015 0125   Sepsis Labs: @LABRCNTIP (procalcitonin:4,lacticidven:4)  ) Recent Results (from the past 240  hour(s))  MRSA PCR Screening     Status: None   Collection Time: 07/31/15 12:24 AM  Result Value Ref Range Status   MRSA by PCR NEGATIVE NEGATIVE Final    Comment:        The GeneXpert MRSA Assay (FDA approved for NASAL specimens only), is one component of a comprehensive MRSA colonization surveillance program. It is not intended to diagnose MRSA infection nor to guide or monitor treatment for MRSA infections.      Scheduled Meds: . sodium chloride   Intravenous Once  . sodium chloride   Intravenous Once  . bisacodyl  10 mg Oral Q12H  . ciprofloxacin  500 mg Oral BID  . digoxin  0.125 mg Oral Daily  . gabapentin  300 mg Oral TID  . insulin aspart  0-15 Units Subcutaneous TID WC  . ipratropium-albuterol  3 mL Nebulization TID  . magnesium oxide  400 mg Oral BID  . omega-3 acid ethyl esters  1,000 mg Oral  Daily  . pravastatin  20 mg Oral q1800  . rOPINIRole  1 mg Oral QHS  . sodium chloride flush  3 mL Intravenous Q12H  . spironolactone  25 mg Oral Daily  . torsemide  60 mg Oral BID   Continuous Infusions:    LOS: 1 day    Time spent: 25 minutes  Kathie Dike, MD [facility] Pager [phone]  If 7PM-7AM, please contact night-coverage [email or web address] Password [email or web address] 08/01/2015, 6:16 AM   By signing my name below, I, Delene Ruffini, attest that this documentation has been prepared under the direction and in the presence of Kathie Dike, MD. Electronically Signed: Delene Ruffini 08/01/15  I, Dr. Kathie Dike, personally performed the services described in this documentaiton. All medical record entries made by the scribe were at my direction and in my presence. I have reviewed the chart and agree that the record reflects my personal performance and is accurate and complete  Kathie Dike, MD, 08/01/2015 6:09 PM

## 2015-08-01 NOTE — Op Note (Signed)
[facility] Patient Name: Jerry Williams Procedure Date: 08/01/2015 2:15 PM MRN: [id number] Date of Birth: Apr 21, 1950 Attending MD: Barney Drain , MD CSN: [id number] Age: 65 Admit Type: Inpatient Procedure:                Upper GI endoscopy WITH COLD FORCEPS BIOPSY OF                            GASTRIC/DUODENAL MUCOSA Indications:              Anemia Providers:                Barney Drain, MD, Janeece Riggers, RN, Lurline Del, RN Referring MD:             Norvel Richards, MD, Mosie Epstein, MD, Abran Richard, MD Medicines:                TCS + Midazolam 2 mg IV Complications:            No immediate complications. Estimated Blood Loss:     Estimated blood loss was minimal. Procedure:                Pre-Anesthesia Assessment:                           - Prior to the procedure, a History and Physical                            was performed, and patient medications and                            allergies were reviewed. The patient's tolerance of                            previous anesthesia was also reviewed. The risks                            and benefits of the procedure and the sedation                            options and risks were discussed with the patient.                            All questions were answered, and informed consent                            was obtained. Prior Anticoagulants: The patient has                            taken no previous anticoagulant or antiplatelet                            agents. ASA Grade Assessment: III - A patient with  severe systemic disease. After reviewing the risks                            and benefits, the patient was deemed in                            satisfactory condition to undergo the procedure.                           After obtaining informed consent, the endoscope was                            passed under direct vision. Throughout the                             procedure, the patient's blood pressure, pulse, and                            oxygen saturations were monitored continuously. The                            [id number] scope was introduced through the                            mouth, and advanced to the second part of duodenum.                            The upper GI endoscopy was accomplished without                            difficulty. The patient tolerated the procedure                            well. Scope In: 2:22:07 PM Scope Out: 2:31:28 PM Total Procedure Duration: 0 hours 9 minutes 21 seconds  Findings:      One moderate (circumferential scarring or stenosis; an endoscope may       pass) benign-appearing, intrinsic stenosis was found. And was traversed.      A small hiatal hernia was present. NO ESOPHAGEAL, GASTRIC, OR DUODENAL       VARICES.      Localized mild inflammation characterized by erythema was found in the       gastric antrum. This was biopsied with a cold forceps for histology.      The examined duodenum was normal. Biopsies for histology were taken with       a cold forceps for evaluation of celiac disease. Impression:               - Benign-appearing PEPTIC STRICTURE.                           - Small hiatal hernia.                           - MILD Gastritis. Biopsied.                           -  NO OBVIOUS SOURCE FOR ANEMIA IDENTIFIED. Moderate Sedation:      Moderate (conscious) sedation was administered by the endoscopy nurse       and supervised by the endoscopist. The following parameters were       monitored: oxygen saturation, heart rate, blood pressure, and response       to care. Total physician intraservice time was 15 minutes. Recommendation:           - Augmented water consumption diet, cardiac diet                            and low sodium diet.                           - Use Protonix (pantoprazole) 40 mg PO daily.                           - Await pathology results.                            - Return to GI clinic in 6 weeks. CONSIDER EGD TO                            PLACE GIVENS CAPSULE IF PATIENT DEVELOPS A                            TRANSFUSION DEPENDENT ANEMIA.                           - Return patient to hospital ward.                           - CONSIDER ADDING SSRI FOR DEPRESSION                           - Continue present medications. Procedure Code(s):        --- Professional ---                           [id number], Esophagogastroduodenoscopy, flexible,                            transoral; with biopsy, single or multiple                           99152, Moderate sedation services provided by the                            same physician or other qualified health care                            professional performing the diagnostic or                            therapeutic service that the sedation supports,  requiring the presence of an independent trained                            observer to assist in the monitoring of the                            patient's level of consciousness and physiological                            status; initial 15 minutes of intraservice time,                            patient age 16 years or older Diagnosis Code(s):        --- Professional ---                           K22.2, Esophageal obstruction                           K44.9, Diaphragmatic hernia without obstruction or                            gangrene                           K29.70, Gastritis, unspecified, without bleeding                           D64.9, Anemia, unspecified CPT copyright 2016 [employer]. All rights reserved. The codes documented in this report are preliminary and upon coder review may  be revised to meet current compliance requirements. Barney Drain, MD Barney Drain, MD 08/01/2015 6:29:32 PM This report has been signed electronically. Number of Addenda: 0

## 2015-08-01 NOTE — H&P (Addendum)
Primary Care Physician:  [facility] Personal [facility] Primary Gastroenterologist:  Dr. Oneida Alar  Pre-Procedure History & Physical: HPI:  Jerry Williams is a 65 y.o. male here for Anemia-NO BRBPR OR MELENA.  Past Medical History:  Diagnosis Date  . Chronic kidney disease   . Colon polyp   . COPD (chronic obstructive pulmonary disease) ([place])   . Coronary artery disease 02/2010   Followed by Dr. [facility] [place]  . Diabetes mellitus   . Hyperlipidemia   . Hypertension   . IBS (irritable bowel syndrome)   . Obese   . OSA (obstructive sleep apnea) 01/2013   Needs BiPAP not afforded  . PONV (postoperative nausea and vomiting)   . PVC's (premature ventricular contractions)     Past Surgical History:  Procedure Laterality Date  . CARDIAC CATHETERIZATION  09/2010   Dr[facility]  . CHOLECYSTECTOMY  02/14/2011   Procedure: LAPAROSCOPIC CHOLECYSTECTOMY;  Surgeon: Donato Heinz, MD;  Location: [facility];  Service: General;  Laterality: N/A;  . CORONARY ARTERY BYPASS GRAFT  09/2010   [facility]-Dr. Lunette Stands  . CYSTECTOMY     Scrotal abscess    Prior to Admission medications   Medication Sig Start Date End Date Taking? Authorizing Provider  aspirin 325 MG tablet Take 325 mg by mouth daily.   Yes Historical Provider, MD  ciprofloxacin (CIPRO) 500 MG tablet Take 500 mg by mouth 2 (two) times daily.   Yes Historical Provider, MD  dicyclomine (BENTYL) 20 MG tablet Take 20 mg by mouth 3 (three) times daily as needed for spasms. For stomach   Yes Historical Provider, MD  digoxin (LANOXIN) 0.125 MG tablet Take 0.125 mg by mouth daily.   Yes Historical Provider, MD  ferrous sulfate 325 (65 FE) MG tablet Take 325 mg by mouth 2 (two) times daily with a meal.   Yes Historical Provider, MD  gabapentin (NEURONTIN) 300 MG capsule Take 300 mg by mouth 3 (three) times daily.   Yes Historical Provider, MD  ipratropium-albuterol (DUONEB) 0.5-2.5 (3) MG/3ML SOLN Take 3 mLs by nebulization 3  (three) times daily. 04/22/13  Yes Samuella Cota, MD  lactulose (CEPHULAC) 10 g packet Take 10 g by mouth 3 (three) times daily as needed (constipation).   Yes Historical Provider, MD  lovastatin (MEVACOR) 20 MG tablet Take 20 mg by mouth at bedtime.   Yes Historical Provider, MD  magnesium oxide (MAG-OX) 400 MG tablet Take 400 mg by mouth 2 (two) times daily.   Yes Historical Provider, MD  metolazone (ZAROXOLYN) 2.5 MG tablet Take 2.5 mg by mouth as directed. Every 3 days   Yes Historical Provider, MD  Omega-3 350 MG CPDR Take 350 mg by mouth daily.   Yes Historical Provider, MD  repaglinide (PRANDIN) 2 MG tablet Take 2 mg by mouth 3 (three) times daily.    Yes Historical Provider, MD  rOPINIRole (REQUIP) 1 MG tablet Take 1 mg by mouth at bedtime.   Yes Historical Provider, MD  sitaGLIPtan-metformin (JANUMET) 50-1000 MG per tablet Take 1 tablet by mouth 2 (two) times daily with a meal.     Yes Historical Provider, MD  spironolactone (ALDACTONE) 25 MG tablet Take 25 mg by mouth daily.   Yes Historical Provider, MD  torsemide (DEMADEX) 20 MG tablet Take 60 mg by mouth 2 (two) times daily.   Yes Historical Provider, MD  albuterol (PROVENTIL) (2.5 MG/3ML) 0.083% nebulizer solution Take 2.5 mg by nebulization every 6 (six) hours as  needed for wheezing or shortness of breath.    Historical Provider, MD  aspirin EC 81 MG EC tablet Take 1 tablet (81 mg total) by mouth daily. Patient not taking: Reported on 07/30/2015 04/22/13   Samuella Cota, MD  cefUROXime (CEFTIN) 500 MG tablet Take 1 tablet (500 mg total) by mouth 2 (two) times daily with a meal. Patient not taking: Reported on 07/30/2015 04/22/13   Samuella Cota, MD  furosemide (LASIX) 20 MG tablet Take 1 tablet (20 mg total) by mouth daily as needed for fluid. Weigh your self daily. Take one tablet daily as needed for increased swelling. Patient not taking: Reported on 07/30/2015 04/23/13   Samuella Cota, MD    Allergies as of 07/30/2015   . (No Known Allergies)    Family History  Problem Relation Age of Onset  . Hypertension Mother   . Emphysema Father   . Heart disease Sister     Enlarged heart    Social History   Social History  . Marital status: Married    Spouse name: N/A  . Number of children: N/A  . Years of education: N/A   Occupational History  . Unemployed, laid off >10 years ago Unemployed   Social History Main Topics  . Smoking status: Former Research scientist (life sciences)  . Smokeless tobacco: Not on file     Comment: Smoked 20 years on and off, quit >17 years ago  . Alcohol use No     Comment: Regular/ heavy use for 20 years, quit >17 years ago  . Drug use: No  . Sexual activity: Not Currently   Other Topics Concern  . Not on file   Social History Narrative   Walks and uses exercise bike    Review of Systems: See HPI, otherwise negative ROS   Physical Exam: BP 110/66   Pulse 95   Temp 98.2 F (36.8 C) (Oral)   Resp (!) 22   Ht 5\' 10"  (1.778 m)   Wt 200 lb (90.7 kg)   SpO2 100%   BMI 28.70 kg/m  General:   Alert,  pleasant and cooperative in NAD Head:  Normocephalic and atraumatic. Neck:  Supple; Lungs:  Clear throughout to auscultation.    Heart:  Regular rate and  Irregular rhythm. Abdomen:  Soft, nontender and nondistended. Normal bowel sounds, without guarding, and without rebound.   Neurologic:  Alert and  oriented x4;  grossly normal neurologically.  Impression/Plan:     Anemia-NO BRBPR OR MELENA.  PLAN:  1. TCS/POSSIBLE EGD TODAY

## 2015-08-02 ENCOUNTER — Telehealth: Payer: Self-pay | Admitting: Gastroenterology

## 2015-08-02 LAB — CBC
HCT: 26.5 % — ABNORMAL LOW (ref 39.0–52.0)
HEMOGLOBIN: 8.6 g/dL — AB (ref 13.0–17.0)
MCH: 27 pg (ref 26.0–34.0)
MCHC: 32.5 g/dL (ref 30.0–36.0)
MCV: 83.1 fL (ref 78.0–100.0)
PLATELETS: 332 10*3/uL (ref 150–400)
RBC: 3.19 MIL/uL — AB (ref 4.22–5.81)
RDW: 16.6 % — ABNORMAL HIGH (ref 11.5–15.5)
WBC: 8.9 10*3/uL (ref 4.0–10.5)

## 2015-08-02 LAB — TYPE AND SCREEN
ABO/RH(D): A POS
Antibody Screen: NEGATIVE
Unit division: 0
Unit division: 0

## 2015-08-02 LAB — GLUCOSE, CAPILLARY
GLUCOSE-CAPILLARY: 222 mg/dL — AB (ref 65–99)
Glucose-Capillary: 362 mg/dL — ABNORMAL HIGH (ref 65–99)

## 2015-08-02 LAB — BASIC METABOLIC PANEL
ANION GAP: 10 (ref 5–15)
BUN: 44 mg/dL — AB (ref 6–20)
CALCIUM: 9 mg/dL (ref 8.9–10.3)
CO2: 24 mmol/L (ref 22–32)
Chloride: 94 mmol/L — ABNORMAL LOW (ref 101–111)
Creatinine, Ser: 1.11 mg/dL (ref 0.61–1.24)
GFR calc Af Amer: >60 mL/min (ref >60)
GLUCOSE: 220 mg/dL — AB (ref 65–99)
Potassium: 3.8 mmol/L (ref 3.5–5.1)
SODIUM: 128 mmol/L — AB (ref 135–145)

## 2015-08-02 LAB — URINE CULTURE: Culture: >=100000 — AB

## 2015-08-02 MED ORDER — CEPHALEXIN 500 MG PO CAPS: 500.0000 mg | ORAL_CAPSULE | Freq: Three times a day (TID) | ORAL | 0 refills | Status: AC

## 2015-08-02 MED ORDER — PANTOPRAZOLE SODIUM 40 MG PO TBEC: 40.0000 mg | DELAYED_RELEASE_TABLET | Freq: Every day | ORAL | 1 refills | Status: AC

## 2015-08-02 NOTE — Telephone Encounter (Signed)
OPV IN 6 WEEKS WITH RMR/APP E30 ANEMIA/ASCITES.

## 2015-08-02 NOTE — Progress Notes (Signed)
1257 d/c instructions, paperwork and hard Rx given to patient and patient's wife. IV catheter removed from LEFT AC, intact w/no s/s of infection noted. Patient's family here to transport patient home. Patient assisted to vehicle via w/c.

## 2015-08-02 NOTE — Discharge Summary (Signed)
Physician Discharge Summary  Jerry Williams [id number] DOB: December 20, 1950 DOA: 07/30/2015  PCP: [place] date: 07/30/2015 Discharge date: 08/02/2015  Admitted From: home Disposition:  home  Recommendations for Outpatient Follow-up:  1. Follow up with PCP in 1-2 weeks 2. Discharge on a course of Keflex for E coli UTI. 3. Follow up with gastroenterology in 6 weeks.  Home Health: none Equipment/Devices: none  Discharge Condition: improved CODE STATUS:full Diet recommendation: carb mod, heart healthy  Brief/Interim Summary: 65 year old male with a hx of remote alcohol abuse, cirrhosis with refractory ascites, presented to the ED at the direction of his PCP for evaluation of progressive abdominal ascites. While in the ED, patient was found to be saturating well, afebrile, and with stable vital signs. He was admitted for further management of worsening chronic anemia and worsening refractory ascites. Abdominal paracentesis was performed 7/28 with the removal of 5700 mL of bloody fluid. Patient received a total of 2 units of pRBCs during his hospital stay. GI has evaluated the patient, and performed an EGD and colonoscopy on 7/29.  Discharge Diagnoses:  Principal Problem:   Ascites Active Problems:   Non-insulin dependent type 2 diabetes mellitus (HCC)   Hypertension   CAD (coronary artery disease)   CKD (chronic kidney disease), stage III   Chronic GI bleeding   Normocytic anemia   Hyponatremia   Leukocytosis   Thrombocytosis (HCC)   Chronic combined systolic and diastolic CHF (congestive heart failure) (HCC)   Cirrhosis (HCC)   UTI (lower urinary tract infection)  1. Cirrhosis with ascites. Likely cardiogenic. Followed by GI at [facility]. Improved s/p abdominal paracentesis was performed 7/28 with the removal of 5700 mL of bloody fluid. GI has evaluated the patient, appreciate input. Continue treatment with torsemide, aldactone, and lactulose.  Patient will need to follow up at [facility] gastroenterology for further care.  2. Chronic combined systolic and diastolic CHF. Past ECHO 4/14 with EF 40-45%, though patient reports more recent ECHO with EF 25%. Fluid restriction and monitor I+Os. Continue torsemide, aldactone, metolazone, and digoxin.  3. Acute on chronic GI blood loss anemia. Hgb 7.4 on admission. Patient received a total of 2 units pRBCs during his hospitalization. FOBT positive. GI has been consulted, input appreciated. Patient underwent colonoscopy and endoscopy. Colonoscopy showed 2 sessile polyps which were removed, colitis, and small hemorrhoids. EGD showed benign appearing peptic stricture, but no obvious source of bleeding. He does not take any frequent NSAIDS. Continue iron supplementation. Continue PPI.  If he continues to have transfusion dependent anemia, he will likely need a capsule endoscopy. 4. Hyponatremia. Likely related to CHF/cirrhosis. Improving with diuresis. 5. E coli UTI. UA equivocal. UC positive for e coli. E coli resistant to Cipro, will discharge on a course of Keflex since it is sensitive to rocephin. 6. CAD s/p CABG. No complaints of CP. Continue statins and aspirin. 7. DM type 2. A1c from 2015 8.2%. Resume outpatient meds. 8. COPD. No evidence of wheeze. Continue management with Duonebs.  Consultants:   GI  Procedures:   US guided paracentesis 7/28: 5700 mL of bloody fluid.  1 unit pRBC transfusion 7/28  1 unit prbc 7/29  Colonoscopy: diverticulosis; hepatic flexure polyp; splenic flexure polyp; distal transverse colon colitis; small, hemorrhoids  Endoscopy: esophageal stricture; hiatal hernia  Antimicrobials:   Cipro 7/27 >>   Discharge Instructions  Discharge Instructions    Diet - low sodium heart healthy    Complete by:  As directed  Diet Carb Modified    Complete by:  As directed   Increase activity slowly    Complete by:  As directed       Medication List    STOP  taking these medications   cefUROXime 500 MG tablet Commonly known as:  CEFTIN   ciprofloxacin 500 MG tablet Commonly known as:  CIPRO   furosemide 20 MG tablet Commonly known as:  LASIX     TAKE these medications   albuterol (2.5 MG/3ML) 0.083% nebulizer solution Commonly known as:  PROVENTIL Take 2.5 mg by nebulization every 6 (six) hours as needed for wheezing or shortness of breath.   aspirin 325 MG tablet Take 325 mg by mouth daily. What changed:  Another medication with the same name was removed. Continue taking this medication, and follow the directions you see here.   cephALEXin 500 MG capsule Commonly known as:  KEFLEX Take 1 capsule (500 mg total) by mouth 3 (three) times daily.   dicyclomine 20 MG tablet Commonly known as:  BENTYL Take 20 mg by mouth 3 (three) times daily as needed for spasms. For stomach   digoxin 0.125 MG tablet Commonly known as:  LANOXIN Take 0.125 mg by mouth daily.   ferrous sulfate 325 (65 FE) MG tablet Take 325 mg by mouth 2 (two) times daily with a meal.   gabapentin 300 MG capsule Commonly known as:  NEURONTIN Take 300 mg by mouth 3 (three) times daily.   ipratropium-albuterol 0.5-2.5 (3) MG/3ML Soln Commonly known as:  DUONEB Take 3 mLs by nebulization 3 (three) times daily.   lactulose 10 g packet Commonly known as:  CEPHULAC Take 10 g by mouth 3 (three) times daily as needed (constipation).   lovastatin 20 MG tablet Commonly known as:  MEVACOR Take 20 mg by mouth at bedtime.   magnesium oxide 400 MG tablet Commonly known as:  MAG-OX Take 400 mg by mouth 2 (two) times daily.   metolazone 2.5 MG tablet Commonly known as:  ZAROXOLYN Take 2.5 mg by mouth as directed. Every 3 days   Omega-3 350 MG Cpdr Take 350 mg by mouth daily.   pantoprazole 40 MG tablet Commonly known as:  PROTONIX Take 1 tablet (40 mg total) by mouth daily before breakfast.   repaglinide 2 MG tablet Commonly known as:  PRANDIN Take 2 mg by  mouth 3 (three) times daily.   rOPINIRole 1 MG tablet Commonly known as:  REQUIP Take 1 mg by mouth at bedtime.   sitaGLIPtin-metformin 50-1000 MG tablet Commonly known as:  JANUMET Take 1 tablet by mouth 2 (two) times daily with a meal.   spironolactone 25 MG tablet Commonly known as:  ALDACTONE Take 25 mg by mouth daily.   torsemide 20 MG tablet Commonly known as:  DEMADEX Take 60 mg by mouth 2 (two) times daily.      Follow-up Information    Barney Drain, MD. Schedule an appointment as soon as possible for a visit in 6 week(s).   Specialty:  Gastroenterology Contact information: [place] [address] [phone]        [place]. Schedule an appointment as soon as possible for a visit in 2 week(s).   Contact information: [place] [address] [phone]          No Known Allergies  Procedures/Studies: US Paracentesis  Result Date: 07/31/2015 INDICATION: Distended abdomen.  Ascites.  Cirrhosis EXAM: ULTRASOUND GUIDED  PARACENTESIS MEDICATIONS: None.  COMPLICATIONS: None immediate. PROCEDURE: Informed written consent was obtained from the patient after a discussion of the risks, benefits and alternatives to treatment. A timeout was performed prior to the initiation of the procedure. Initial ultrasound scanning demonstrates a large amount of ascites within the right lower abdominal quadrant. The right lower abdomen was prepped and draped in the usual sterile fashion. 1% lidocaine with epinephrine was used for local anesthesia. Following this, a Yueh catheter was introduced. An ultrasound image was saved for documentation purposes. The paracentesis was performed. The catheter was removed and a dressing was applied. The patient tolerated the procedure well without immediate post procedural complication. FINDINGS: A total of approximately 5700 mL of bloody fluid was removed. IMPRESSION: Successful  ultrasound-guided paracentesis yielding 5,700 mL of bloody peritoneal fluid. Electronically Signed   By: Franchot Gallo M.D.   On: 07/31/2015 09:55   Subjective: Feeling well. Breathing doing okay.   Discharge Exam: Vitals:   08/01/15 2208 08/02/15 0625  BP: (!) 105/54 (!) 125/53  Pulse: 68 95  Resp: 20 20  Temp: 98 F (36.7 C) 97.7 F (36.5 C)   Vitals:   08/01/15 1932 08/01/15 2208 08/02/15 0625 08/02/15 0934  BP:  (!) 105/54 (!) 125/53   Pulse:  68 95   Resp:  20 20   Temp:  98 F (36.7 C) 97.7 F (36.5 C)   TempSrc:  Oral Oral   SpO2: 96% 99% 100% 97%  Weight:   86.3 kg (190 lb 5.4 oz)   Height:       Examination:  General exam: Appears calm and comfortable  Respiratory system: Clear to auscultation. Respiratory effort normal. Cardiovascular system: S1 & S2 heard, RRR. No JVD, murmurs, rubs, gallops or clicks. Trace pedal edema. Gastrointestinal system: Abdomen is distended, soft and nontender. No organomegaly or masses felt. Normal bowel sounds heard. Central nervous system: Alert and oriented. No focal neurological deficits. Extremities: Symmetric 5 x 5 power. Skin: No rashes, lesions or ulcers Psychiatry: Judgement and insight appear normal. Mood & affect appropriate.   The results of significant diagnostics from this hospitalization (including imaging, microbiology, ancillary and laboratory) are listed below for reference.     Microbiology: Recent Results (from the past 240 hour(s))  MRSA PCR Screening     Status: None   Collection Time: 07/31/15 12:24 AM  Result Value Ref Range Status   MRSA by PCR NEGATIVE NEGATIVE Final    Comment:        The GeneXpert MRSA Assay (FDA approved for NASAL specimens only), is one component of a comprehensive MRSA colonization surveillance program. It is not intended to diagnose MRSA infection nor to guide or monitor treatment for MRSA infections.   Urine culture     Status: Abnormal   Collection Time: 07/31/15  1:25  AM  Result Value Ref Range Status   Specimen Description URINE, CLEAN CATCH  Final   Special Requests NONE  Final   Culture >=100,000 COLONIES/mL ESCHERICHIA COLI (A)  Final   Report Status 08/02/2015 FINAL  Final   Organism ID, Bacteria ESCHERICHIA COLI (A)  Final      Susceptibility   Escherichia coli - MIC*    AMPICILLIN >=32 RESISTANT Resistant     CEFAZOLIN <=4 SENSITIVE Sensitive     CEFTRIAXONE <=1 SENSITIVE Sensitive     CIPROFLOXACIN >=4 RESISTANT Resistant     GENTAMICIN <=1 SENSITIVE Sensitive     IMIPENEM <=0.25 SENSITIVE Sensitive     NITROFURANTOIN <=16 SENSITIVE Sensitive  TRIMETH/SULFA >=320 RESISTANT Resistant     AMPICILLIN/SULBACTAM >=32 RESISTANT Resistant     PIP/TAZO 8 SENSITIVE Sensitive     * >=100,000 COLONIES/mL ESCHERICHIA COLI     Labs: BNP (last 3 results) No results for input(s): BNP in the last 8760 hours. Basic Metabolic Panel:  Recent Labs Lab 07/30/15 1800 07/31/15 0616 08/01/15 0550 08/02/15 0631  NA 123* 129* 128* 128*  K 4.3 3.9 3.6 3.8  CL 89* 93* 92* 94*  CO2 24 28 26 24   GLUCOSE 102* 172* 218* 220*  BUN 61* 65* 55* 44*  CREATININE 1.40* 1.61* 1.35* 1.11  CALCIUM 10.2 10.1 9.4 9.0   Liver Function Tests:  Recent Labs Lab 07/30/15 1800  AST 41  ALT 13*  ALKPHOS 281*  BILITOT 0.4  PROT 8.2*  ALBUMIN 2.1*   No results for input(s): LIPASE, AMYLASE in the last 168 hours. No results for input(s): AMMONIA in the last 168 hours. CBC:  Recent Labs Lab 07/30/15 2011 07/31/15 [id number] 08/01/15 0550 08/02/15 0631  WBC 11.5* 7.9 9.5 8.9  NEUTROABS 9.2*  --   --   --   HGB 7.4* 7.1* 7.5* 8.6*  HCT 23.4* 22.3* 23.3* 26.5*  MCV 82.1 82.9 82.3 83.1  PLT 425* 379 311 332   Cardiac Enzymes: No results for input(s): CKTOTAL, CKMB, CKMBINDEX, TROPONINI in the last 168 hours. BNP: Invalid input(s): POCBNP CBG:  Recent Labs Lab 08/01/15 1129 08/01/15 1311 08/01/15 1718 08/01/15 2206 08/02/15 0742  GLUCAP 238* 201*  176* 364* 222*    Anemia work up No results for input(s): VITAMINB12, FOLATE, FERRITIN, TIBC, IRON, RETICCTPCT in the last 72 hours. Urinalysis    Component Value Date/Time   COLORURINE YELLOW 07/31/2015 0125   APPEARANCEUR CLEAR 07/31/2015 0125   LABSPEC 1.010 07/31/2015 0125   PHURINE 5.5 07/31/2015 0125   GLUCOSEU NEGATIVE 07/31/2015 0125   HGBUR NEGATIVE 07/31/2015 0125   BILIRUBINUR NEGATIVE 07/31/2015 0125   KETONESUR NEGATIVE 07/31/2015 0125   PROTEINUR NEGATIVE 07/31/2015 0125   UROBILINOGEN 1.0 04/16/2013 1808   NITRITE POSITIVE (A) 07/31/2015 0125   LEUKOCYTESUR TRACE (A) 07/31/2015 0125   Sepsis Labs Invalid input(s): PROCALCITONIN,  WBC,  LACTICIDVEN Microbiology Recent Results (from the past 240 hour(s))  MRSA PCR Screening     Status: None   Collection Time: 07/31/15 12:24 AM  Result Value Ref Range Status   MRSA by PCR NEGATIVE NEGATIVE Final    Comment:        The GeneXpert MRSA Assay (FDA approved for NASAL specimens only), is one component of a comprehensive MRSA colonization surveillance program. It is not intended to diagnose MRSA infection nor to guide or monitor treatment for MRSA infections.   Urine culture     Status: Abnormal   Collection Time: 07/31/15  1:25 AM  Result Value Ref Range Status   Specimen Description URINE, CLEAN CATCH  Final   Special Requests NONE  Final   Culture >=100,000 COLONIES/mL ESCHERICHIA COLI (A)  Final   Report Status 08/02/2015 FINAL  Final   Organism ID, Bacteria ESCHERICHIA COLI (A)  Final      Susceptibility   Escherichia coli - MIC*    AMPICILLIN >=32 RESISTANT Resistant     CEFAZOLIN <=4 SENSITIVE Sensitive     CEFTRIAXONE <=1 SENSITIVE Sensitive     CIPROFLOXACIN >=4 RESISTANT Resistant     GENTAMICIN <=1 SENSITIVE Sensitive     IMIPENEM <=0.25 SENSITIVE Sensitive     NITROFURANTOIN <=16 SENSITIVE Sensitive  TRIMETH/SULFA >=320 RESISTANT Resistant     AMPICILLIN/SULBACTAM >=32 RESISTANT Resistant      PIP/TAZO 8 SENSITIVE Sensitive     * >=100,000 COLONIES/mL ESCHERICHIA COLI     Time coordinating discharge: Over 30 minutes  SIGNED:  Kathie Dike, MD  [facility] 08/02/2015, 10:57 AM Pager   If 7PM-7AM, please contact night-coverage [email or web address] Password [email or web address]  By signing my name below, I, Delene Ruffini, attest that this documentation has been prepared under the direction and in the presence of Kathie Dike, MD. Electronically Signed: Delene Ruffini 08/02/15 10:30am  I, Dr. Kathie Dike, personally performed the services described in this documentaiton. All medical record entries made by the scribe were at my direction and in my presence. I have reviewed the chart and agree that the record reflects my personal performance and is accurate and complete  Kathie Dike, MD, 08/02/2015 10:57 AM

## 2015-08-04 ENCOUNTER — Encounter (HOSPITAL_COMMUNITY): Payer: Self-pay | Admitting: Gastroenterology

## 2015-08-04 NOTE — Telephone Encounter (Signed)
APPT MADE

## 2015-08-11 NOTE — Telephone Encounter (Signed)
Please call pt. His stomach Bx shows gastritis. HE HAD TWO SIMPLE ADENOMAS REMOVED.  HIS SMALL BOWEL BIOPSIES ARE NORMAL. THE CHANGES IN HIS LEFT COLON ARE DUE TLOW BLOOD SUPPLY TO IN HIS LEFT COLON. NO SOURCE FOR YOUR LOW BLOOD COUNT WAS IDENTIFIED. HIS LOW BLOOD COUNT IS MOST LIKELY DUE TO  HIS CHRONIC MEDICAL PROBLEMS.

## 2015-08-12 NOTE — Telephone Encounter (Signed)
Pt is aware of results and of his next appt date and time.

## 2015-09-10 ENCOUNTER — Telehealth: Payer: Self-pay | Admitting: Internal Medicine

## 2015-09-10 NOTE — Telephone Encounter (Signed)
CALLED PT TO REMIND HIM OF APPOINTMENT AND HIS BROTHER, JAMES, STATED HE WAS IN [place] IN [facility] FOR KIDNEY SURGERY.

## 2015-09-10 NOTE — Telephone Encounter (Signed)
noted 

## 2015-09-11 ENCOUNTER — Ambulatory Visit: Payer: BLUE CROSS/BLUE SHIELD | Admitting: Internal Medicine

## 2015-09-11 NOTE — Telephone Encounter (Signed)
Communication noted.  

## 2016-01-13 ENCOUNTER — Other Ambulatory Visit (HOSPITAL_COMMUNITY): Payer: Self-pay | Admitting: Family Medicine

## 2016-01-13 DIAGNOSIS — R188 Other ascites: Secondary | ICD-10-CM

## 2016-01-14 ENCOUNTER — Ambulatory Visit (HOSPITAL_COMMUNITY)
Admission: RE | Admit: 2016-01-14 | Discharge: 2016-01-14 | Disposition: A | Discharge status: Home / Self Care | Payer: BLUE CROSS/BLUE SHIELD | Source: Ambulatory Visit | Attending: Family Medicine | Admitting: Family Medicine

## 2016-01-14 ENCOUNTER — Other Ambulatory Visit (HOSPITAL_COMMUNITY): Payer: Self-pay | Admitting: Family Medicine

## 2016-01-14 DIAGNOSIS — R188 Other ascites: Secondary | ICD-10-CM

## 2016-01-14 DIAGNOSIS — K7460 Unspecified cirrhosis of liver: Secondary | ICD-10-CM | POA: Insufficient documentation

## 2016-01-14 DIAGNOSIS — R0602 Shortness of breath: Secondary | ICD-10-CM | POA: Diagnosis not present

## 2016-01-14 DIAGNOSIS — R1031 Right lower quadrant pain: Secondary | ICD-10-CM | POA: Diagnosis not present

## 2017-08-03 DEATH — deceased
